# Patient Record
Sex: Female | Born: 1935 | Hispanic: No | Marital: Married | Smoking: Never smoker
Health system: Southern US, Community
[De-identification: ages and names within clinical notes are randomized; demographics above are authoritative.]

## PROBLEM LIST (undated history)

## (undated) ENCOUNTER — Emergency Department (HOSPITAL_COMMUNITY): Admission: EM | Payer: Self-pay | Source: Home / Self Care

## (undated) DIAGNOSIS — I255 Ischemic cardiomyopathy: Secondary | ICD-10-CM

## (undated) DIAGNOSIS — E785 Hyperlipidemia, unspecified: Secondary | ICD-10-CM

## (undated) DIAGNOSIS — I2109 ST elevation (STEMI) myocardial infarction involving other coronary artery of anterior wall: Secondary | ICD-10-CM

## (undated) DIAGNOSIS — I2510 Atherosclerotic heart disease of native coronary artery without angina pectoris: Secondary | ICD-10-CM

## (undated) DIAGNOSIS — E039 Hypothyroidism, unspecified: Secondary | ICD-10-CM

---

## 2017-01-20 DIAGNOSIS — C44311 Basal cell carcinoma of skin of nose: Secondary | ICD-10-CM | POA: Diagnosis not present

## 2017-01-20 DIAGNOSIS — L209 Atopic dermatitis, unspecified: Secondary | ICD-10-CM | POA: Diagnosis not present

## 2017-01-20 DIAGNOSIS — L820 Inflamed seborrheic keratosis: Secondary | ICD-10-CM | POA: Diagnosis not present

## 2017-02-02 DIAGNOSIS — D0439 Carcinoma in situ of skin of other parts of face: Secondary | ICD-10-CM | POA: Diagnosis not present

## 2017-05-03 DIAGNOSIS — E039 Hypothyroidism, unspecified: Secondary | ICD-10-CM | POA: Diagnosis not present

## 2017-05-03 DIAGNOSIS — Z79899 Other long term (current) drug therapy: Secondary | ICD-10-CM | POA: Diagnosis not present

## 2017-05-03 DIAGNOSIS — R110 Nausea: Secondary | ICD-10-CM | POA: Diagnosis not present

## 2017-05-03 DIAGNOSIS — E785 Hyperlipidemia, unspecified: Secondary | ICD-10-CM | POA: Diagnosis not present

## 2017-08-29 DIAGNOSIS — N3001 Acute cystitis with hematuria: Secondary | ICD-10-CM | POA: Diagnosis not present

## 2017-08-29 DIAGNOSIS — N3090 Cystitis, unspecified without hematuria: Secondary | ICD-10-CM | POA: Diagnosis not present

## 2017-08-29 DIAGNOSIS — M546 Pain in thoracic spine: Secondary | ICD-10-CM | POA: Diagnosis not present

## 2017-12-22 DIAGNOSIS — Z0000 Encounter for general adult medical examination without abnormal findings: Secondary | ICD-10-CM | POA: Diagnosis not present

## 2017-12-22 DIAGNOSIS — Z1331 Encounter for screening for depression: Secondary | ICD-10-CM | POA: Diagnosis not present

## 2017-12-22 DIAGNOSIS — E785 Hyperlipidemia, unspecified: Secondary | ICD-10-CM | POA: Diagnosis not present

## 2017-12-27 DIAGNOSIS — Z1211 Encounter for screening for malignant neoplasm of colon: Secondary | ICD-10-CM | POA: Diagnosis not present

## 2017-12-27 DIAGNOSIS — Z79899 Other long term (current) drug therapy: Secondary | ICD-10-CM | POA: Diagnosis not present

## 2017-12-27 DIAGNOSIS — E039 Hypothyroidism, unspecified: Secondary | ICD-10-CM | POA: Diagnosis not present

## 2017-12-27 DIAGNOSIS — Z0000 Encounter for general adult medical examination without abnormal findings: Secondary | ICD-10-CM | POA: Diagnosis not present

## 2017-12-27 DIAGNOSIS — E785 Hyperlipidemia, unspecified: Secondary | ICD-10-CM | POA: Diagnosis not present

## 2018-03-06 DIAGNOSIS — M67431 Ganglion, right wrist: Secondary | ICD-10-CM | POA: Diagnosis not present

## 2018-03-06 DIAGNOSIS — Z23 Encounter for immunization: Secondary | ICD-10-CM | POA: Diagnosis not present

## 2018-03-06 DIAGNOSIS — E039 Hypothyroidism, unspecified: Secondary | ICD-10-CM | POA: Diagnosis not present

## 2018-04-25 ENCOUNTER — Inpatient Hospital Stay (HOSPITAL_COMMUNITY)
Admission: AD | Admit: 2018-04-25 | Discharge: 2018-04-28 | DRG: 246 | Disposition: A | Discharge status: Home / Self Care | Payer: Medicare Other | Attending: Interventional Cardiology | Admitting: Interventional Cardiology

## 2018-04-25 ENCOUNTER — Inpatient Hospital Stay (HOSPITAL_COMMUNITY)
Admission: AD | Disposition: A | Discharge status: Home / Self Care | Payer: Self-pay | Attending: Interventional Cardiology

## 2018-04-25 ENCOUNTER — Other Ambulatory Visit: Payer: Self-pay

## 2018-04-25 ENCOUNTER — Encounter (HOSPITAL_COMMUNITY): Payer: Self-pay | Admitting: Interventional Cardiology

## 2018-04-25 DIAGNOSIS — E039 Hypothyroidism, unspecified: Secondary | ICD-10-CM | POA: Diagnosis present

## 2018-04-25 DIAGNOSIS — I2109 ST elevation (STEMI) myocardial infarction involving other coronary artery of anterior wall: Secondary | ICD-10-CM | POA: Diagnosis not present

## 2018-04-25 DIAGNOSIS — R7303 Prediabetes: Secondary | ICD-10-CM | POA: Diagnosis not present

## 2018-04-25 DIAGNOSIS — I219 Acute myocardial infarction, unspecified: Secondary | ICD-10-CM | POA: Diagnosis present

## 2018-04-25 DIAGNOSIS — I5041 Acute combined systolic (congestive) and diastolic (congestive) heart failure: Secondary | ICD-10-CM | POA: Diagnosis present

## 2018-04-25 DIAGNOSIS — I2510 Atherosclerotic heart disease of native coronary artery without angina pectoris: Secondary | ICD-10-CM | POA: Diagnosis present

## 2018-04-25 DIAGNOSIS — I255 Ischemic cardiomyopathy: Secondary | ICD-10-CM | POA: Diagnosis not present

## 2018-04-25 DIAGNOSIS — Z955 Presence of coronary angioplasty implant and graft: Secondary | ICD-10-CM

## 2018-04-25 DIAGNOSIS — I499 Cardiac arrhythmia, unspecified: Secondary | ICD-10-CM | POA: Diagnosis not present

## 2018-04-25 DIAGNOSIS — I5021 Acute systolic (congestive) heart failure: Secondary | ICD-10-CM | POA: Diagnosis not present

## 2018-04-25 DIAGNOSIS — I213 ST elevation (STEMI) myocardial infarction of unspecified site: Secondary | ICD-10-CM | POA: Diagnosis not present

## 2018-04-25 DIAGNOSIS — R0902 Hypoxemia: Secondary | ICD-10-CM | POA: Diagnosis not present

## 2018-04-25 DIAGNOSIS — I2102 ST elevation (STEMI) myocardial infarction involving left anterior descending coronary artery: Secondary | ICD-10-CM

## 2018-04-25 DIAGNOSIS — E785 Hyperlipidemia, unspecified: Secondary | ICD-10-CM | POA: Diagnosis not present

## 2018-04-25 DIAGNOSIS — R54 Age-related physical debility: Secondary | ICD-10-CM | POA: Diagnosis present

## 2018-04-25 DIAGNOSIS — I361 Nonrheumatic tricuspid (valve) insufficiency: Secondary | ICD-10-CM | POA: Diagnosis not present

## 2018-04-25 DIAGNOSIS — E782 Mixed hyperlipidemia: Secondary | ICD-10-CM | POA: Diagnosis not present

## 2018-04-25 DIAGNOSIS — R0789 Other chest pain: Secondary | ICD-10-CM | POA: Diagnosis not present

## 2018-04-25 DIAGNOSIS — R079 Chest pain, unspecified: Secondary | ICD-10-CM | POA: Diagnosis not present

## 2018-04-25 HISTORY — DX: ST elevation (STEMI) myocardial infarction involving other coronary artery of anterior wall: I21.09

## 2018-04-25 HISTORY — PX: LEFT HEART CATH AND CORONARY ANGIOGRAPHY: CATH118249

## 2018-04-25 HISTORY — DX: Hypothyroidism, unspecified: E03.9

## 2018-04-25 HISTORY — DX: Atherosclerotic heart disease of native coronary artery without angina pectoris: I25.10

## 2018-04-25 HISTORY — PX: CORONARY STENT INTERVENTION: CATH118234

## 2018-04-25 HISTORY — DX: Hyperlipidemia, unspecified: E78.5

## 2018-04-25 HISTORY — DX: Ischemic cardiomyopathy: I25.5

## 2018-04-25 LAB — HEMOGLOBIN A1C
Hgb A1c MFr Bld: 5.7 % — ABNORMAL HIGH (ref 4.8–5.6)
MEAN PLASMA GLUCOSE: 116.89 mg/dL

## 2018-04-25 LAB — COMPREHENSIVE METABOLIC PANEL
ALK PHOS: 64 U/L (ref 38–126)
ALT: 47 U/L (ref 14–54)
ANION GAP: 10 (ref 5–15)
AST: 211 U/L — ABNORMAL HIGH (ref 15–41)
Albumin: 3.1 g/dL — ABNORMAL LOW (ref 3.5–5.0)
BILIRUBIN TOTAL: 0.6 mg/dL (ref 0.3–1.2)
BUN: 13 mg/dL (ref 6–20)
CALCIUM: 8.7 mg/dL — AB (ref 8.9–10.3)
CO2: 23 mmol/L (ref 22–32)
Chloride: 108 mmol/L (ref 101–111)
Creatinine, Ser: 0.99 mg/dL (ref 0.44–1.00)
GFR, EST AFRICAN AMERICAN: 55 mL/min — AB (ref >60)
GFR, EST NON AFRICAN AMERICAN: 47 mL/min — AB (ref >60)
Glucose, Bld: 150 mg/dL — ABNORMAL HIGH (ref 65–99)
Potassium: 5.3 mmol/L — ABNORMAL HIGH (ref 3.5–5.1)
Sodium: 141 mmol/L (ref 135–145)
TOTAL PROTEIN: 6.5 g/dL (ref 6.5–8.1)

## 2018-04-25 LAB — PROTIME-INR
INR: 1.11
PROTHROMBIN TIME: 14.2 s (ref 11.4–15.2)

## 2018-04-25 LAB — POCT I-STAT, CHEM 8
BUN: 13 mg/dL (ref 6–20)
CALCIUM ION: 1.21 mmol/L (ref 1.15–1.40)
CHLORIDE: 107 mmol/L (ref 101–111)
CREATININE: 0.8 mg/dL (ref 0.44–1.00)
GLUCOSE: 159 mg/dL — AB (ref 65–99)
HCT: 34 % — ABNORMAL LOW (ref 36.0–46.0)
Hemoglobin: 11.6 g/dL — ABNORMAL LOW (ref 12.0–15.0)
Potassium: 3.6 mmol/L (ref 3.5–5.1)
Sodium: 143 mmol/L (ref 135–145)
TCO2: 20 mmol/L — ABNORMAL LOW (ref 22–32)

## 2018-04-25 LAB — TROPONIN I: Troponin I: >65 ng/mL (ref <0.03)

## 2018-04-25 LAB — CBC
HEMATOCRIT: 36.5 % (ref 36.0–46.0)
Hemoglobin: 12 g/dL (ref 12.0–15.0)
MCH: 30.5 pg (ref 26.0–34.0)
MCHC: 32.9 g/dL (ref 30.0–36.0)
MCV: 92.9 fL (ref 78.0–100.0)
Platelets: 232 10*3/uL (ref 150–400)
RBC: 3.93 MIL/uL (ref 3.87–5.11)
RDW: 12.7 % (ref 11.5–15.5)
WBC: 10.8 10*3/uL — ABNORMAL HIGH (ref 4.0–10.5)

## 2018-04-25 LAB — POCT ACTIVATED CLOTTING TIME
Activated Clotting Time: 483 seconds
Activated Clotting Time: 164 seconds

## 2018-04-25 LAB — MRSA PCR SCREENING: MRSA by PCR: NEGATIVE

## 2018-04-25 LAB — APTT: aPTT: 39 seconds — ABNORMAL HIGH (ref 24–36)

## 2018-04-25 SURGERY — LEFT HEART CATH AND CORONARY ANGIOGRAPHY
Anesthesia: LOCAL

## 2018-04-25 MED ORDER — HEPARIN SODIUM (PORCINE) 1000 UNIT/ML IJ SOLN
INTRAMUSCULAR | Status: DC | PRN
  Administered 2018-04-25: 4000 [IU] via INTRAVENOUS

## 2018-04-25 MED ORDER — NITROGLYCERIN 1 MG/10 ML FOR IR/CATH LAB
INTRA_ARTERIAL | Status: DC | PRN
  Administered 2018-04-25: 200 ug via INTRACORONARY

## 2018-04-25 MED ORDER — TICAGRELOR 90 MG PO TABS
ORAL_TABLET | ORAL | Status: AC
  Filled 2018-04-25: qty 2

## 2018-04-25 MED ORDER — HEPARIN (PORCINE) IN NACL 2-0.9 UNITS/ML
INTRAMUSCULAR | Status: AC | PRN
  Administered 2018-04-25 (×2): 500 mL

## 2018-04-25 MED ORDER — BIVALIRUDIN BOLUS VIA INFUSION - CUPID
INTRAVENOUS | Status: DC | PRN
  Administered 2018-04-25: 57 mg via INTRAVENOUS

## 2018-04-25 MED ORDER — SODIUM CHLORIDE 0.9 % IV SOLN: 250.0000 mL | INTRAVENOUS | Status: DC | PRN

## 2018-04-25 MED ORDER — HEPARIN SODIUM (PORCINE) 1000 UNIT/ML IJ SOLN
INTRAMUSCULAR | Status: AC
  Filled 2018-04-25: qty 1

## 2018-04-25 MED ORDER — ATORVASTATIN CALCIUM 80 MG PO TABS
80.0000 mg | ORAL_TABLET | Freq: Every day | ORAL | Status: DC
  Administered 2018-04-25 – 2018-04-27 (×3): 80 mg via ORAL
  Filled 2018-04-25 (×3): qty 1

## 2018-04-25 MED ORDER — METOPROLOL TARTRATE 12.5 MG HALF TABLET
12.5000 mg | ORAL_TABLET | Freq: Two times a day (BID) | ORAL | Status: DC
  Administered 2018-04-25 – 2018-04-28 (×6): 12.5 mg via ORAL
  Filled 2018-04-25 (×7): qty 1

## 2018-04-25 MED ORDER — BIVALIRUDIN TRIFLUOROACETATE 250 MG IV SOLR
INTRAVENOUS | Status: AC
  Filled 2018-04-25: qty 250

## 2018-04-25 MED ORDER — LIDOCAINE HCL (PF) 1 % IJ SOLN
INTRAMUSCULAR | Status: AC
  Filled 2018-04-25: qty 30

## 2018-04-25 MED ORDER — SODIUM CHLORIDE 0.9 % IV SOLN
INTRAVENOUS | Status: AC
  Administered 2018-04-25: 14:00:00 via INTRAVENOUS

## 2018-04-25 MED ORDER — SODIUM CHLORIDE 0.9% FLUSH
3.0000 mL | Freq: Two times a day (BID) | INTRAVENOUS | Status: DC
  Administered 2018-04-26 – 2018-04-28 (×5): 3 mL via INTRAVENOUS

## 2018-04-25 MED ORDER — TICAGRELOR 90 MG PO TABS
90.0000 mg | ORAL_TABLET | Freq: Two times a day (BID) | ORAL | Status: DC
  Administered 2018-04-25 – 2018-04-28 (×6): 90 mg via ORAL
  Filled 2018-04-25 (×6): qty 1

## 2018-04-25 MED ORDER — ONDANSETRON HCL 4 MG/2ML IJ SOLN: 4.0000 mg | Freq: Four times a day (QID) | INTRAMUSCULAR | Status: DC | PRN

## 2018-04-25 MED ORDER — TICAGRELOR 90 MG PO TABS
ORAL_TABLET | ORAL | Status: DC | PRN
  Administered 2018-04-25: 180 mg via ORAL

## 2018-04-25 MED ORDER — TIROFIBAN HCL IN NACL 5-0.9 MG/100ML-% IV SOLN
INTRAVENOUS | Status: DC | PRN
  Administered 2018-04-25: 0.15 ug/kg/min via INTRAVENOUS

## 2018-04-25 MED ORDER — SODIUM CHLORIDE 0.9 % IV SOLN
INTRAVENOUS | Status: AC | PRN
  Administered 2018-04-25: 1.75 mg/kg/h via INTRAVENOUS

## 2018-04-25 MED ORDER — ACETAMINOPHEN 325 MG PO TABS: 650.0000 mg | ORAL_TABLET | ORAL | Status: DC | PRN

## 2018-04-25 MED ORDER — LIDOCAINE HCL (PF) 1 % IJ SOLN
INTRAMUSCULAR | Status: DC | PRN
  Administered 2018-04-25: 20 mL

## 2018-04-25 MED ORDER — SODIUM CHLORIDE 0.9% FLUSH: 3.0000 mL | INTRAVENOUS | Status: DC | PRN

## 2018-04-25 MED ORDER — ASPIRIN 81 MG PO CHEW
81.0000 mg | CHEWABLE_TABLET | Freq: Every day | ORAL | Status: DC
Start: 2018-04-25 — End: 2018-04-28
  Administered 2018-04-26 – 2018-04-28 (×3): 81 mg via ORAL
  Filled 2018-04-25 (×3): qty 1

## 2018-04-25 MED ORDER — TIROFIBAN (AGGRASTAT) BOLUS VIA INFUSION
INTRAVENOUS | Status: DC | PRN
  Administered 2018-04-25: 1900 ug via INTRAVENOUS

## 2018-04-25 MED ORDER — IOHEXOL 350 MG/ML SOLN
INTRAVENOUS | Status: DC | PRN
  Administered 2018-04-25: 165 mL via INTRAVENOUS

## 2018-04-25 MED ORDER — HYDRALAZINE HCL 20 MG/ML IJ SOLN: 5.0000 mg | INTRAMUSCULAR | Status: AC | PRN

## 2018-04-25 MED ORDER — ORAL CARE MOUTH RINSE
15.0000 mL | Freq: Two times a day (BID) | OROMUCOSAL | Status: DC
  Administered 2018-04-25: 15 mL via OROMUCOSAL

## 2018-04-25 MED ORDER — TIROFIBAN HCL IN NACL 5-0.9 MG/100ML-% IV SOLN
INTRAVENOUS | Status: AC
  Filled 2018-04-25: qty 100

## 2018-04-25 MED ORDER — HEPARIN (PORCINE) IN NACL 1000-0.9 UT/500ML-% IV SOLN
INTRAVENOUS | Status: AC
  Filled 2018-04-25: qty 1000

## 2018-04-25 MED ORDER — LABETALOL HCL 5 MG/ML IV SOLN: 10.0000 mg | INTRAVENOUS | Status: AC | PRN

## 2018-04-25 SURGICAL SUPPLY — 15 items
BALLN SAPPHIRE 2.5X12 (BALLOONS) ×2
BALLOON SAPPHIRE 2.5X12 (BALLOONS) IMPLANT
CATH EXTRAC PRONTO 5.5F 138CM (CATHETERS) ×1 IMPLANT
CATH INFINITI 5FR MULTPACK ANG (CATHETERS) ×1 IMPLANT
CATH LAUNCHER 6FR EBU3.5 (CATHETERS) ×1 IMPLANT
KIT HEART LEFT (KITS) ×2 IMPLANT
PACK CARDIAC CATHETERIZATION (CUSTOM PROCEDURE TRAY) ×2 IMPLANT
SHEATH PINNACLE 6F 10CM (SHEATH) ×1 IMPLANT
STENT SYNERGY DES 2.75X20 (Permanent Stent) ×1 IMPLANT
STENT SYNERGY DES 3X12 (Permanent Stent) ×1 IMPLANT
TRANSDUCER W/STOPCOCK (MISCELLANEOUS) ×2 IMPLANT
TUBING CIL FLEX 10 FLL-RA (TUBING) ×2 IMPLANT
WIRE ASAHI PROWATER 180CM (WIRE) ×1 IMPLANT
WIRE EMERALD 3MM-J .035X150CM (WIRE) ×1 IMPLANT
WIRE HI TORQ BMW 190CM (WIRE) ×1 IMPLANT

## 2018-04-25 NOTE — Plan of Care (Signed)
  Problem: Education: Goal: Understanding of CV disease, CV risk reduction, and recovery process will improve Outcome: Progressing Note:  Education ongoing   Problem: Activity: Goal: Ability to return to baseline activity level will improve Outcome: Progressing Note:  Bedrest up at 0030   Problem: Cardiovascular: Goal: Ability to achieve and maintain adequate cardiovascular perfusion will improve Outcome: Progressing Note:  Within parameters Goal: Vascular access site(s) Level 0-1 will be maintained Outcome: Progressing Note:  Level 1. Small bruise   Problem: Health Behavior/Discharge Planning: Goal: Ability to safely manage health-related needs after discharge will improve Outcome: Progressing

## 2018-04-25 NOTE — Progress Notes (Signed)
1741 ACT 142 Right Femoral Arterial Sheath removed per protocol. Site held x 30 minutes. Groin Level 1. Instructed patient to apply pressure to right groin if she lifts her head, coughs, sneezes, laughs, etc.  Instructed on bedrest until 0011 tonight.Verbalized understanding.

## 2018-04-25 NOTE — Progress Notes (Signed)
Dr. Irish Lack notified of Critical Lab Value Troponin >65. Expected value. No new orders received.

## 2018-04-25 NOTE — H&P (Signed)
Cardiology Admission History and Physical:   Patient ID: April Yang; MRN: [id number]; DOB: 1921-06-08   Admission date: 04/25/2018  Primary Care Provider: No primary care provider on file. Primary Cardiologist: No primary care provider on file. [place] Primary Electrophysiologist:   Chief Complaint:  Chest pain/back pain  Patient Profile:   April Yang is a 84 y.o. female with a history of hypothyroidism who is otherwise healthy and lives independently.   History of Present Illness:   Ms. Loder  began having severe back and chest pain at about 8AM.  It lasted several hours and a neighbor came to check on her.  THe patient was noted to be sweaty and uncomfortable.  EMS was called.  ECG revealed anterior ST elevation.    She was sent to [facility] for emergency eval.  In the ER, she was still uncomfortable.    Rushed to emergent cath which limited time for obtaining history.   Past Medical History:  Diagnosis Date  . Hypothyroidism        Medications Prior to Admission: Prior to Admission medications   Not on File     Allergies:   Allergies not on file  Social History:   Social History   Socioeconomic History  . Marital status: Married    Spouse name: Not on file  . Number of children: Not on file  . Years of education: Not on file  . Highest education level: Not on file  Occupational History  . Not on file  Social Needs  . Financial resource strain: Not on file  . Food insecurity:    Worry: Not on file    Inability: Not on file  . Transportation needs:    Medical: Not on file    Non-medical: Not on file  Tobacco Use  . Smoking status: Never Smoker  Substance and Sexual Activity  . Alcohol use: Not on file  . Drug use: Not on file  . Sexual activity: Not on file  Lifestyle  . Physical activity:    Days per week: Not on file    Minutes per session: Not on file  . Stress: Not on file  Relationships  . Social connections:    Talks on phone: Not on file      Gets together: Not on file    Attends religious service: Not on file    Active member of club or organization: Not on file    Attends meetings of clubs or organizations: Not on file    Relationship status: Not on file  . Intimate partner violence:    Fear of current or ex partner: Not on file    Emotionally abused: Not on file    Physically abused: Not on file    Forced sexual activity: Not on file  Other Topics Concern  . Not on file  Social History Narrative  . Not on file    Family History:  Not assessed due to emergent nature of illness The patient's family history is not on file.    ROS:  Please see the history of present illness. Chest pain All other ROS reviewed and negative.     Physical Exam/Data:   Vitals:   04/25/18 1509 04/25/18 1515 04/25/18 1530 04/25/18 1545  BP: (!) 155/77 133/80 130/67 122/66  Pulse: 65 66 66 (!) 56  Resp:  17 15 15   Temp:      TempSrc:      SpO2:  97% 100% 98%    Intake/Output  Summary (Last 24 hours) at 04/25/2018 1601 Last data filed at 04/25/2018 1500 Gross per 24 hour  Intake 56 ml  Output -  Net 56 ml   There were no vitals filed for this visit. There is no height or weight on file to calculate BMI.  General:  Grey, sweaty, in severe acute distress HEENT: normal Lymph: no adenopathy Neck: no JVD Endocrine:  No thryomegaly Vascular: No carotid bruits; FA pulses 2+ bilaterally without bruits  Cardiac:  normal S1, S2; RRR; no murmur  Lungs:  clear to auscultation bilaterally, no wheezing, rhonchi or rales  Abd: soft, nontender, no hepatomegaly  Ext: no edema 2+ right femoral pulse Musculoskeletal:  No deformities, BUE and BLE strength normal and equal Skin: warm and dry  Neuro:  CNs 2-12 intact, no focal abnormalities noted Psych:  Normal affect    EKG:  The ECG that was done by EMS was personally reviewed and demonstrates NSR, anterior ST elevation  Relevant CV Studies: ECG as above  Laboratory Data:  ChemistryNo  results for input(s): NA, K, CL, CO2, GLUCOSE, BUN, CREATININE, CALCIUM, GFRNONAA, GFRAA, ANIONGAP in the last 168 hours.  No results for input(s): PROT, ALBUMIN, AST, ALT, ALKPHOS, BILITOT in the last 168 hours. Hematology Recent Labs  Lab 04/25/18 1357  WBC 10.8*  RBC 3.93  HGB 12.0  HCT 36.5  MCV 92.9  MCH 30.5  MCHC 32.9  RDW 12.7  PLT 232   Cardiac Enzymes Recent Labs  Lab 04/25/18 1357  TROPONINI >65.00*   No results for input(s): TROPIPOC in the last 168 hours.  BNPNo results for input(s): BNP, PROBNP in the last 168 hours.  DDimer No results for input(s): DDIMER in the last 168 hours.  Radiology/Studies:  No results found.  Assessment and Plan:   1. Acute anterior MI: I personally evaluated the patient in the ER and made the decision for her to come for emergent cath.  SHe lives independently and gardens regularly.  Due to her excellent health status, we elected to take her for cath.  She received 2 DES to the LAD.  SHe had significant RCA disease as well.  LVEDP was moderately elevated.    SHe likely has acute systolic heart failure.  Watch volume status.  DAPT, start lipid lowering therapy.    Severity of Illness: The appropriate patient status for this patient is INPATIENT. Inpatient status is judged to be reasonable and necessary in order to provide the required intensity of service to ensure the patient's safety. The patient's presenting symptoms, physical exam findings, and initial radiographic and laboratory data in the context of their chronic comorbidities is felt to place them at high risk for further clinical deterioration. Furthermore, it is not anticipated that the patient will be medically stable for discharge from the hospital within 2 midnights of admission. The following factors support the patient status of inpatient.   " The patient's presenting symptoms include chest pain. " The worrisome physical exam findings include pale color,  diaphoresis. " The initial radiographic and laboratory data are worrisome because of anterior STEMI. " The chronic co-morbidities include .   * I certify that at the point of admission it is my clinical judgment that the patient will require inpatient hospital care spanning beyond 2 midnights from the point of admission due to high intensity of service, high risk for further deterioration and high frequency of surveillance required.*    For questions or updates, please contact [place] Please consult [email or web address] for contact  info under Cardiology/STEMI.    SignedLarae Grooms, MD  04/25/2018 4:01 PM

## 2018-04-26 ENCOUNTER — Other Ambulatory Visit: Payer: Self-pay

## 2018-04-26 ENCOUNTER — Inpatient Hospital Stay (HOSPITAL_COMMUNITY): Payer: Medicare Other

## 2018-04-26 ENCOUNTER — Encounter (HOSPITAL_COMMUNITY): Payer: Self-pay

## 2018-04-26 DIAGNOSIS — R7303 Prediabetes: Secondary | ICD-10-CM

## 2018-04-26 DIAGNOSIS — I5021 Acute systolic (congestive) heart failure: Secondary | ICD-10-CM

## 2018-04-26 DIAGNOSIS — I361 Nonrheumatic tricuspid (valve) insufficiency: Secondary | ICD-10-CM

## 2018-04-26 DIAGNOSIS — E782 Mixed hyperlipidemia: Secondary | ICD-10-CM

## 2018-04-26 LAB — COMPREHENSIVE METABOLIC PANEL
ALBUMIN: 3.1 g/dL — AB (ref 3.5–5.0)
ALT: 47 U/L (ref 14–54)
ANION GAP: 9 (ref 5–15)
AST: 186 U/L — ABNORMAL HIGH (ref 15–41)
Alkaline Phosphatase: 67 U/L (ref 38–126)
BILIRUBIN TOTAL: 0.7 mg/dL (ref 0.3–1.2)
BUN: 14 mg/dL (ref 6–20)
CO2: 24 mmol/L (ref 22–32)
Calcium: 8.9 mg/dL (ref 8.9–10.3)
Chloride: 108 mmol/L (ref 101–111)
Creatinine, Ser: 0.98 mg/dL (ref 0.44–1.00)
GFR calc Af Amer: 55 mL/min — ABNORMAL LOW (ref >60)
GFR calc non Af Amer: 48 mL/min — ABNORMAL LOW (ref >60)
GLUCOSE: 150 mg/dL — AB (ref 65–99)
POTASSIUM: 4.8 mmol/L (ref 3.5–5.1)
Sodium: 141 mmol/L (ref 135–145)
TOTAL PROTEIN: 6.7 g/dL (ref 6.5–8.1)

## 2018-04-26 LAB — CBC
HCT: 36.5 % (ref 36.0–46.0)
Hemoglobin: 11.8 g/dL — ABNORMAL LOW (ref 12.0–15.0)
MCH: 30.3 pg (ref 26.0–34.0)
MCHC: 32.3 g/dL (ref 30.0–36.0)
MCV: 93.8 fL (ref 78.0–100.0)
PLATELETS: 240 10*3/uL (ref 150–400)
RBC: 3.89 MIL/uL (ref 3.87–5.11)
RDW: 13 % (ref 11.5–15.5)
WBC: 8 10*3/uL (ref 4.0–10.5)

## 2018-04-26 LAB — ECHOCARDIOGRAM COMPLETE
HEIGHTINCHES: 65 in
WEIGHTICAEL: 3058.22 [oz_av]

## 2018-04-26 LAB — POCT ACTIVATED CLOTTING TIME: ACTIVATED CLOTTING TIME: 142 s

## 2018-04-26 LAB — TROPONIN I: Troponin I: >65 ng/mL (ref <0.03)

## 2018-04-26 MED ORDER — FUROSEMIDE 10 MG/ML IJ SOLN
20.0000 mg | Freq: Once | INTRAMUSCULAR | Status: AC
  Administered 2018-04-26: 20 mg via INTRAVENOUS
  Filled 2018-04-26: qty 2

## 2018-04-26 MED FILL — Heparin Sod (Porcine)-NaCl IV Soln 1000 Unit/500ML-0.9%: INTRAVENOUS | Qty: 1000 | Status: AC

## 2018-04-26 NOTE — Plan of Care (Signed)
  Problem: Education: Goal: Knowledge of General Education information will improve Outcome: Progressing   Problem: Clinical Measurements: Goal: Ability to maintain clinical measurements within normal limits will improve Outcome: Progressing   Problem: Clinical Measurements: Goal: Will remain free from infection Outcome: Progressing   Problem: Activity: Goal: Risk for activity intolerance will decrease Outcome: Progressing   Problem: Nutrition: Goal: Adequate nutrition will be maintained Outcome: Progressing   Problem: Coping: Goal: Level of anxiety will decrease Outcome: Progressing   Problem: Elimination: Goal: Will not experience complications related to bowel motility Outcome: Progressing   Problem: Pain Managment: Goal: General experience of comfort will improve Outcome: Progressing   Problem: Safety: Goal: Ability to remain free from injury will improve Outcome: Progressing

## 2018-04-26 NOTE — Progress Notes (Signed)
Patient complaining on left side chest pain that lasted just a few minutes. EKG completed. Called Dr. Christ Kick and reported situation to him. Echo at bedside. No orders given. Will continue to monitor.

## 2018-04-26 NOTE — Progress Notes (Addendum)
Progress Note  Patient Name: April Yang Date of Encounter: 04/26/2018  Primary Cardiologist: No primary care provider on file.   Subjective   S/p LAD PCI yesterday.  Inpatient Medications    Scheduled Meds: . aspirin  81 mg Oral Daily  . atorvastatin  80 mg Oral q1800  . mouth rinse  15 mL Mouth Rinse BID  . metoprolol tartrate  12.5 mg Oral BID  . sodium chloride flush  3 mL Intravenous Q12H  . ticagrelor  90 mg Oral BID   Continuous Infusions: . sodium chloride     PRN Meds: sodium chloride, acetaminophen, ondansetron (ZOFRAN) IV, sodium chloride flush   Vital Signs    Vitals:   04/26/18 0500 04/26/18 0600 04/26/18 0700 04/26/18 0724  BP:  128/79 115/70   Pulse: 75 68 65   Resp: (!) 24 17 (!) 23   Temp:    98.9 F (37.2 C)  TempSrc:    Oral  SpO2: 94% 95% 92%   Weight:      Height:        Intake/Output Summary (Last 24 hours) at 04/26/2018 0824 Last data filed at 04/26/2018 0400 Gross per 24 hour  Intake 416 ml  Output 600 ml  Net -184 ml   Filed Weights   04/25/18 1915  Weight: 191 lb 2.2 oz (86.7 kg)    Telemetry    NSR - Personally Reviewed  ECG    NSR, anterior Q waves with mild ST elevation- better than inital - Personally Reviewed  Physical Exam   GEN: No acute distress.   Neck: No JVD Cardiac: RRR, no murmurs, rubs, or gallops.  Respiratory: Clear to auscultation bilaterally. GI: Soft, nontender, non-distended  MS: No edema; No deformity. Neuro:  Nonfocal  Psych: Normal affect   Labs    Chemistry Recent Labs  Lab 04/25/18 1236 04/25/18 2150 04/26/18 0212  NA 143 141 141  K 3.6 5.3* 4.8  CL 107 108 108  CO2  --  23 24  GLUCOSE 159* 150* 150*  BUN 13 13 14   CREATININE 0.80 0.99 0.98  CALCIUM  --  8.7* 8.9  PROT  --  6.5 6.7  ALBUMIN  --  3.1* 3.1*  AST  --  211* 186*  ALT  --  47 47  ALKPHOS  --  64 67  BILITOT  --  0.6 0.7  GFRNONAA  --  47* 48*  GFRAA  --  55* 55*  ANIONGAP  --  10 9      Hematology Recent Labs  Lab 04/25/18 1236 04/25/18 1357 04/26/18 0212  WBC  --  10.8* 8.0  RBC  --  3.93 3.89  HGB 11.6* 12.0 11.8*  HCT 34.0* 36.5 36.5  MCV  --  92.9 93.8  MCH  --  30.5 30.3  MCHC  --  32.9 32.3  RDW  --  12.7 13.0  PLT  --  232 240    Cardiac Enzymes Recent Labs  Lab 04/25/18 1357 04/25/18 2150 04/26/18 0212  TROPONINI >65.00* >65.00* >65.00*   No results for input(s): TROPIPOC in the last 168 hours.   BNPNo results for input(s): BNP, PROBNP in the last 168 hours.   DDimer No results for input(s): DDIMER in the last 168 hours.   Radiology    No results found.  Cardiac Studies   Cath showing occluded LAD  Patient Profile     84 y.o. female with anterior MI  Assessment & Plan  Anterior MI: DAPT for 12 months.  Echo to eval LV function.  Suspect there will be some LV dysfunction- mild acute systolic heart falure.  She has some orthopnea.  Will give a dose of Lasix. Check BMet in AM.    High dose statin.  LDL target will be 70.  Watch in ICU today given frailty.  Will plan to transfer to tele tomorrow if she is doing well.   Prediabetes with A1C 5.7. Healthy diet.  For questions or updates, please contact [place] Please consult [email or web address] for contact info under Cardiology/STEMI.      Signed, Larae Grooms, MD  04/26/2018, 8:24 AM

## 2018-04-26 NOTE — Progress Notes (Signed)
  Echocardiogram 2D Echocardiogram has been performed.  Madelaine Etienne 04/26/2018, 2:15 PM

## 2018-04-26 NOTE — Progress Notes (Addendum)
Jacqualin Combes, RN        # 12.  S/W ISHA @ [facility] # [phone]    BRILINTA  90 MG  BID  COVER- YES  CO-PAY- $ 45.00  Q/L TWO PILL PER DAYS  TIER- 3 DRUG  PRIOR APPROVAL- NO    PREFERRED PHARMACY : [facility], [facility] AND [facility]    Brilinta 30 day free card provided to patient and family, questions answered.

## 2018-04-27 LAB — LIPID PANEL
CHOLESTEROL: 158 mg/dL (ref 0–200)
HDL: 30 mg/dL — ABNORMAL LOW (ref >40)
LDL Cholesterol: 88 mg/dL (ref 0–99)
TRIGLYCERIDES: 198 mg/dL — AB (ref <150)
Total CHOL/HDL Ratio: 5.3 RATIO
VLDL: 40 mg/dL (ref 0–40)

## 2018-04-27 LAB — BASIC METABOLIC PANEL
Anion gap: 9 (ref 5–15)
BUN: 16 mg/dL (ref 6–20)
CHLORIDE: 108 mmol/L (ref 101–111)
CO2: 22 mmol/L (ref 22–32)
Calcium: 8.7 mg/dL — ABNORMAL LOW (ref 8.9–10.3)
Creatinine, Ser: 0.95 mg/dL (ref 0.44–1.00)
GFR, EST AFRICAN AMERICAN: 58 mL/min — AB (ref >60)
GFR, EST NON AFRICAN AMERICAN: 50 mL/min — AB (ref >60)
Glucose, Bld: 116 mg/dL — ABNORMAL HIGH (ref 65–99)
Potassium: 3.6 mmol/L (ref 3.5–5.1)
SODIUM: 139 mmol/L (ref 135–145)

## 2018-04-27 MED ORDER — SPIRONOLACTONE 12.5 MG HALF TABLET: 12.5000 mg | ORAL_TABLET | Freq: Every day | ORAL | Status: DC

## 2018-04-27 MED ORDER — LEVOTHYROXINE SODIUM 75 MCG PO TABS: 75.0000 ug | ORAL_TABLET | Freq: Every day | ORAL | Status: DC

## 2018-04-27 MED ORDER — LEVOTHYROXINE SODIUM 75 MCG PO TABS
75.0000 ug | ORAL_TABLET | Freq: Every day | ORAL | Status: DC
  Administered 2018-04-28: 75 ug via ORAL
  Filled 2018-04-27: qty 1

## 2018-04-27 MED ORDER — SPIRONOLACTONE 12.5 MG HALF TABLET
12.5000 mg | ORAL_TABLET | Freq: Every day | ORAL | Status: DC
  Administered 2018-04-27 – 2018-04-28 (×2): 12.5 mg via ORAL
  Filled 2018-04-27 (×2): qty 1

## 2018-04-27 NOTE — Plan of Care (Signed)
Pt gets very confused at night and has to be reoriented that she is in the hospital and not at home.

## 2018-04-27 NOTE — Evaluation (Signed)
Physical Therapy Evaluation Patient Details Name: CARLTON SWEANEY MRN: [id number] DOB: 12-04-1920 Today's Date: 04/27/2018   History of Present Illness  84 yo admitted with acute anterior STEMI s/p PCI. PMHx: hypothyroidism  Clinical Impression  Pt pleasant and agreeable to PT with 2 daughters present and supportive. Pt able to ambulate functionally with RW and navigate 5 stairs sideways with BUE on railing. Pt demonstrates deficits related to balance as pt was previously using no AD but displayed significantly decreased stability without RW during today's session. Pt would benefit from acute PT to address above deficits and improve level of independence. Pt SpO2 98% on RA after activity. HR: 68 pre gait > 90 during ambulation. BP pre: 113/62; BP post: 111/71  Follow Up Recommendations Home health PT;Supervision/Assistance - 24 hour    Equipment Recommendations  None recommended by PT    Recommendations for Other Services       Precautions / Restrictions Precautions Precautions: Fall      Mobility  Bed Mobility               General bed mobility comments: in chair on arrival  Transfers Overall transfer level: Needs assistance   Transfers: Sit to/from Stand Sit to Stand: Min guard         General transfer comment: cues for hand placement, sequence and safety  Ambulation/Gait Ambulation/Gait assistance: Min guard Ambulation Distance (Feet): 100 Feet Assistive device: Rolling walker (2 wheeled) Gait Pattern/deviations: Step-through pattern;Decreased stride length;Trunk flexed   Gait velocity interpretation: <1.8 ft/sec, indicate of risk for recurrent falls General Gait Details: slow gait with flexed trunk, cues to step into RW and to clear obstacles  Stairs Stairs: Yes Stairs assistance: Supervision Stair Management: Step to pattern;Sideways Number of Stairs: 4 General stair comments: pt able to perform safely with rail   Wheelchair Mobility    Modified  [place] (Stroke Patients Only)       Balance Overall balance assessment: Needs assistance Sitting-balance support: No upper extremity supported;Feet supported Sitting balance-Leahy Scale: Good Sitting balance - Comments: Pt able to sit without UE support and turn for donning back gown   Standing balance support: No upper extremity supported Standing balance-Leahy Scale: Fair Standing balance comment: Pt able to stand without AD but begins reaching for objects when beginning to ambulate                             Pertinent Vitals/Pain Pain Assessment: No/denies pain    Home Living Family/patient expects to be discharged to:: Private residence Living Arrangements: Alone;Children Available Help at Discharge: Family Type of Home: House Home Access: Stairs to enter Entrance Stairs-Rails: Psychiatric nurse of Steps: 4 Home Layout: One level Home Equipment: Environmental consultant - 2 wheels;Cane - single point Additional Comments: pt stays in her house during the day and spends the night at her daughters house    Prior Function Level of Independence: Independent         Comments: pt doesn't drive, cooks for herself, kids bring her the groceries     Hand Dominance        Extremity/Trunk Assessment   Upper Extremity Assessment Upper Extremity Assessment: Generalized weakness    Lower Extremity Assessment Lower Extremity Assessment: Generalized weakness    Cervical / Trunk Assessment Cervical / Trunk Assessment: Kyphotic  Communication   Communication: HOH  Cognition Arousal/Alertness: Awake/alert Behavior During Therapy: WFL for tasks assessed/performed Overall Cognitive Status: Within Functional Limits for tasks  assessed                                        General Comments      Exercises     Assessment/Plan    PT Assessment Patient needs continued PT services  PT Problem List Decreased strength;Decreased mobility;Decreased  safety awareness;Decreased activity tolerance;Decreased knowledge of use of DME       PT Treatment Interventions Gait training;Therapeutic activities;Therapeutic exercise;DME instruction;Functional mobility training;Balance training;Patient/family education    PT Goals (Current goals can be found in the Care Plan section)  Acute Rehab PT Goals Patient Stated Goal: take care of my tomato plants PT Goal Formulation: With patient/family Time For Goal Achievement: 05/11/18 Potential to Achieve Goals: Good    Frequency Min 3X/week   Barriers to discharge        Co-evaluation               AM-PAC PT "6 Clicks" Daily Activity  Outcome Measure Difficulty turning over in bed (including adjusting bedclothes, sheets and blankets)?: A Little Difficulty moving from lying on back to sitting on the side of the bed? : A Little Difficulty sitting down on and standing up from a chair with arms (e.g., wheelchair, bedside commode, etc,.)?: A Little Help needed moving to and from a bed to chair (including a wheelchair)?: A Little Help needed walking in hospital room?: A Little Help needed climbing 3-5 steps with a railing? : None 6 Click Score: 19    End of Session Equipment Utilized During Treatment: Gait belt Activity Tolerance: Patient tolerated treatment well Patient left: in chair;with call bell/phone within reach;with chair alarm set;with family/visitor present Nurse Communication: Mobility status PT Visit Diagnosis: Other abnormalities of gait and mobility (R26.89);Muscle weakness (generalized) (M62.81)    Time: 4431-5400 PT Time Calculation (min) (ACUTE ONLY): 20 min   Charges:   PT Evaluation $PT Eval Moderate Complexity: 1 Mod     PT G Codes:        Gabe Arnett Duddy, SPT  [employer] 04/27/2018, 1:30 PM

## 2018-04-27 NOTE — Progress Notes (Signed)
Progress Note  Patient Name: April Yang Date of Encounter: 04/27/2018  Primary Cardiologist: No primary care provider on file.   Subjective   Had some chest pain yesterday.  Inpatient Medications    Scheduled Meds: . aspirin  81 mg Oral Daily  . atorvastatin  80 mg Oral q1800  . [START ON 04/28/2018] levothyroxine  75 mcg Oral QAC breakfast  . metoprolol tartrate  12.5 mg Oral BID  . sodium chloride flush  3 mL Intravenous Q12H  . spironolactone  12.5 mg Oral Daily  . ticagrelor  90 mg Oral BID   Continuous Infusions: . sodium chloride     PRN Meds: sodium chloride, acetaminophen, ondansetron (ZOFRAN) IV, sodium chloride flush   Vital Signs    Vitals:   04/27/18 0500 04/27/18 0700 04/27/18 0800 04/27/18 0900  BP: 105/68 130/63 117/66 (!) 85/49  Pulse: 67 63 69 75  Resp: (!) 25 17 (!) 25 (!) 24  Temp:      TempSrc:      SpO2: 95% 98% 98% 98%  Weight:      Height:        Intake/Output Summary (Last 24 hours) at 04/27/2018 1139 Last data filed at 04/27/2018 0800 Gross per 24 hour  Intake 900 ml  Output 300 ml  Net 600 ml   Filed Weights   04/25/18 1915  Weight: 191 lb 2.2 oz (86.7 kg)    Telemetry    NSR - Personally Reviewed  ECG    NSR, improved ST segments - Personally Reviewed  Physical Exam   GEN: No acute distress.   Neck: No JVD Cardiac: RRR, no murmurs, rubs, or gallops.  Respiratory: Clear to auscultation bilaterally. GI: Soft, nontender, non-distended  MS: No edema; No deformity. Neuro:  Nonfocal  Psych: Normal affect   Labs    Chemistry Recent Labs  Lab 04/25/18 2150 04/26/18 0212 04/27/18 0330  NA 141 141 139  K 5.3* 4.8 3.6  CL 108 108 108  CO2 23 24 22   GLUCOSE 150* 150* 116*  BUN 13 14 16   CREATININE 0.99 0.98 0.95  CALCIUM 8.7* 8.9 8.7*  PROT 6.5 6.7  --   ALBUMIN 3.1* 3.1*  --   AST 211* 186*  --   ALT 47 47  --   ALKPHOS 64 67  --   BILITOT 0.6 0.7  --   GFRNONAA 47* 48* 50*  GFRAA 55* 55* 58*  ANIONGAP  10 9 9      Hematology Recent Labs  Lab 04/25/18 1236 04/25/18 1357 04/26/18 0212  WBC  --  10.8* 8.0  RBC  --  3.93 3.89  HGB 11.6* 12.0 11.8*  HCT 34.0* 36.5 36.5  MCV  --  92.9 93.8  MCH  --  30.5 30.3  MCHC  --  32.9 32.3  RDW  --  12.7 13.0  PLT  --  232 240    Cardiac Enzymes Recent Labs  Lab 04/25/18 1357 04/25/18 2150 04/26/18 0212  TROPONINI >65.00* >65.00* >65.00*   No results for input(s): TROPIPOC in the last 168 hours.   BNPNo results for input(s): BNP, PROBNP in the last 168 hours.   DDimer No results for input(s): DDIMER in the last 168 hours.   Radiology    No results found.  Cardiac Studies   Echo showed EF 35-40%  Patient Profile     84 y.o. female with anterior STEMI  Assessment & Plan    1) Doing well.  EF  decreased.  Start spironolactone 12.5 mg daily as she had some volume overload sx.  Use spiro rather than lasix.  Check BMet in AM. Move to tele. COntinue high dose statin.  DAPT and beta blocker started.  Holding off on ACE due to low BP.    Possible discharge in AM.  Need to get her walking.  Will order PT consult.  For questions or updates, please contact [place] Please consult [email or web address] for contact info under Cardiology/STEMI.      Signed, Larae Grooms, MD  04/27/2018, 11:39 AM

## 2018-04-28 ENCOUNTER — Encounter (HOSPITAL_COMMUNITY): Payer: Self-pay | Admitting: Physician Assistant

## 2018-04-28 LAB — BASIC METABOLIC PANEL
Anion gap: 11 (ref 5–15)
BUN: 15 mg/dL (ref 6–20)
CALCIUM: 8.7 mg/dL — AB (ref 8.9–10.3)
CO2: 21 mmol/L — ABNORMAL LOW (ref 22–32)
Chloride: 104 mmol/L (ref 101–111)
Creatinine, Ser: 0.96 mg/dL (ref 0.44–1.00)
GFR calc Af Amer: 57 mL/min — ABNORMAL LOW (ref >60)
GFR, EST NON AFRICAN AMERICAN: 49 mL/min — AB (ref >60)
Glucose, Bld: 106 mg/dL — ABNORMAL HIGH (ref 65–99)
POTASSIUM: 3.3 mmol/L — AB (ref 3.5–5.1)
SODIUM: 136 mmol/L (ref 135–145)

## 2018-04-28 MED ORDER — ASPIRIN EC 81 MG PO TBEC: 81.0000 mg | DELAYED_RELEASE_TABLET | Freq: Every day | ORAL | Status: DC

## 2018-04-28 MED ORDER — SPIRONOLACTONE 25 MG PO TABS: 12.5000 mg | ORAL_TABLET | Freq: Every day | ORAL | 6 refills | Status: DC

## 2018-04-28 MED ORDER — ATORVASTATIN CALCIUM 80 MG PO TABS: 80.0000 mg | ORAL_TABLET | Freq: Every day | ORAL | 6 refills | Status: DC

## 2018-04-28 MED ORDER — NITROGLYCERIN 0.4 MG SL SUBL: 0.4000 mg | SUBLINGUAL_TABLET | SUBLINGUAL | 12 refills | Status: AC | PRN

## 2018-04-28 MED ORDER — METOPROLOL TARTRATE 25 MG PO TABS: 12.5000 mg | ORAL_TABLET | Freq: Two times a day (BID) | ORAL | 6 refills | Status: DC

## 2018-04-28 MED ORDER — TICAGRELOR 90 MG PO TABS: 90.0000 mg | ORAL_TABLET | Freq: Two times a day (BID) | ORAL | 11 refills | Status: DC

## 2018-04-28 MED ORDER — POTASSIUM CHLORIDE CRYS ER 20 MEQ PO TBCR
40.0000 meq | EXTENDED_RELEASE_TABLET | Freq: Once | ORAL | Status: AC
  Administered 2018-04-28: 40 meq via ORAL
  Filled 2018-04-28: qty 2

## 2018-04-28 NOTE — Progress Notes (Signed)
CARDIAC REHAB PHASE I   PRE:  Rate/Rhythm: 67 SR    BP: sitting 131/66    SaO2: 98 RA  MODE:  Ambulation: 190 ft   POST:  Rate/Rhythm: 83 SR    BP: sitting 111/59     SaO2: 96 RA  Pt able to stand and walk with RW. Tolerated well, steady, just tired toward end. Return to recliner, VSS. Ed completed with pt and two daughters. Good reception. Understands importance of Brilinta/ASA. Discussed HF and low sodium and daily wts. Will refer to [place] [id number]   [place], ACSM 04/28/2018 11:47 AM

## 2018-04-28 NOTE — Discharge Summary (Addendum)
Discharge Summary    Patient ID: April Yang,  MRN: [id number], DOB/AGE: 1920/12/08 84 y.o.  Admit date: 04/25/2018 Discharge date: 04/28/2018  Primary Care Provider: Bertram Millard A Primary Cardiologist: Dr. Irish Lack  Discharge Diagnoses    Active Problems:   Acute anterior wall MI [facility])   Acute systolic and diastolic CHF   ICM   HLD   Pre-diabetes   Hypothyroidism   Allergies No Known Allergies  Diagnostic Studies/Procedures   LEFT HEART CATH AND CORONARY ANGIOGRAPHY 04/25/2018  Conclusion    Prox RCA lesion is 75% stenosed.  Mid Cx lesion is 25% stenosed.  LV end diastolic pressure is moderately elevated. LVEDP 23 mm Hg.  There is no aortic valve stenosis.  Prox LAD lesion is 100% stenosed.  A stent was successfully placed using a STENT SYNERGY DES 3X12.  A drug-eluting stent was successfully placed using a STENT SYNERGY DES 2.75X20.  Post intervention, there is a 0% residual stenosis.     Diagnostic Diagram       Post-Intervention Diagram          Echo 04/26/2018 Study Conclusions  - Left ventricle: The cavity size was normal. Wall thickness was   normal. Systolic function was moderately reduced. The estimated   ejection fraction was in the range of 35% to 40%. Severe   anterior, anteroseptal and apical and inferoapical hypokinesis,   suggestive of LAD territory ischemia/infarct. Doppler parameters   are consistent with abnormal left ventricular relaxation (grade 1   diastolic dysfunction). The E/e&' ratio is >15, suggesting   elevated LV filling pressure. - Mitral valve: Mildly thickened leaflets . There was trivial   regurgitation. - Left atrium: The atrium was normal in size. - Right ventricle: The cavity size was normal. Wall thickness was   normal. Systolic function is mildly reduced. - Tricuspid valve: There was mild regurgitation. - Pulmonary arteries: PA peak pressure: 30 mm Hg (S). - Inferior vena cava: The vessel was  dilated. The respirophasic   diameter changes were blunted (< 50%), consistent with elevated   central venous pressure. - Pericardium, extracardiac: A trivial pericardial effusion was   identified posterior to the heart.  Impressions:  - LVEF 35-40%, normal wall thickness, severe anterior,   anteroseptal, apical and inferoapical hypokinesis suggestive of   LAD territory ischemia/infarct, grade 1 DD with elevated LV   filling pressure, trivial MR, normal LA size, mildly reduced RV   systolic function, mild TR, RVSP 30 mmHg, dilated IVC, trivial   posterior pericardial effusion.  History of Present Illness     April Yang is a 83 y.o. female with a history of hypothyroidism who is otherwise healthy and lives independently presented 04/25/18 with anterior STEMI.   April Yang  began having severe back and chest pain at about 8AM on 04/25/18.  It lasted several hours and a neighbor came to check on her.  THe patient was noted to be sweaty and uncomfortable.  EMS was called.  ECG revealed anterior ST elevation.    She was sent to [facility] for emergency eval.  In the ER, she was still uncomfortable and taken to cath lab directly.   Hospital Course     Consultants: None  1. Acute anterior MI: Emergent cath showed 100% stenosed pLAD s/p PTCA and DES x 2. She had significant RCA (70%)  disease as well>> medical management .  LVEDP was moderately elevated. No recurrent chest pain. Ambulated well without any discomfort with cardiac rehab.  Continue ASA, Brillinta, statin and BB.  2. ICM/Acute combined CHF: Elevated LVEDP by cath. She had orthopnea. Given Lasix x 1 with improved symptoms. Added spironolactone for diuresis and to help increase LV function. BP soft to add ACE/ARB. Consider as outpatient. Heart failure education given.  3. HLD: 04/27/2018: Cholesterol 158; HDL 30; LDL Cholesterol 88; Triglycerides 198; VLDL 40. Continue Statin. Lipid panel and LFTs in 6 weeks.   The patient has been seen by  Dr. Irish Lack today and deemed ready for discharge home. All follow-up appointments have been scheduled. Discharge medications are listed below.    Discharge Vitals Blood pressure (!) 103/52, pulse 66, temperature 98.8 F (37.1 C), temperature source Oral, resp. rate 14, height 5\' 5"  (1.651 m), weight 191 lb 2.2 oz (86.7 kg), SpO2 94 %.  Filed Weights   04/25/18 1915  Weight: 191 lb 2.2 oz (86.7 kg)    Labs & Radiologic Studies     CBC Recent Labs    04/25/18 1357 04/26/18 0212  WBC 10.8* 8.0  HGB 12.0 11.8*  HCT 36.5 36.5  MCV 92.9 93.8  PLT 232 299   Basic Metabolic Panel Recent Labs    04/27/18 0330 04/28/18 0638  NA 139 136  K 3.6 3.3*  CL 108 104  CO2 22 21*  GLUCOSE 116* 106*  BUN 16 15  CREATININE 0.95 0.96  CALCIUM 8.7* 8.7*   Liver Function Tests Recent Labs    04/25/18 2150 04/26/18 0212  AST 211* 186*  ALT 47 47  ALKPHOS 64 67  BILITOT 0.6 0.7  PROT 6.5 6.7  ALBUMIN 3.1* 3.1*   Cardiac Enzymes Recent Labs    04/25/18 1357 04/25/18 2150 04/26/18 0212  TROPONINI >65.00* >65.00* >65.00*   Hemoglobin A1C Recent Labs    04/25/18 2150  HGBA1C 5.7*   Fasting Lipid Panel Recent Labs    04/27/18 0330  CHOL 158  HDL 30*  LDLCALC 88  TRIG 198*  CHOLHDL 5.3    Disposition   Pt is being discharged home today in good condition.  Follow-up Plans & Appointments    Follow-up Information    Imogene Burn, PA-C. Go on 05/09/2018.   Specialty:  Cardiology Why:  @2pm  for TCM follow up Contact information: [address] [place] [address] [phone]          Discharge Instructions    Amb Referral to Cardiac Rehabilitation   Complete by:  As directed    Diagnosis:   Coronary Stents PTCA STEMI     Diet - low sodium heart healthy   Complete by:  As directed    Discharge instructions   Complete by:  As directed    No driving for 2 weeks. No lifting over 10 lbs for 4 weeks. Keep procedure site clean &  dry. If you notice increased pain, swelling, bleeding or pus, call/return!  You may shower, but no soaking baths/hot tubs/pools for 1 week.   Increase activity slowly   Complete by:  As directed       Discharge Medications   Allergies as of 04/28/2018   No Known Allergies     Medication List    TAKE these medications   aspirin EC 81 MG tablet Take 1 tablet (81 mg total) by mouth daily.   atorvastatin 80 MG tablet Commonly known as:  LIPITOR Take 1 tablet (80 mg total) by mouth daily at 6 PM.   levothyroxine 75 MCG tablet Commonly known as:  SYNTHROID,  LEVOTHROID Take 75 mcg by mouth daily before breakfast.   metoprolol tartrate 25 MG tablet Commonly known as:  LOPRESSOR Take 0.5 tablets (12.5 mg total) by mouth 2 (two) times daily.   nitroGLYCERIN 0.4 MG SL tablet Commonly known as:  NITROSTAT Place 1 tablet (0.4 mg total) under the tongue every 5 (five) minutes as needed.   spironolactone 25 MG tablet Commonly known as:  ALDACTONE Take 0.5 tablets (12.5 mg total) by mouth daily. Start taking on:  04/29/2018   ticagrelor 90 MG Tabs tablet Commonly known as:  BRILINTA Take 1 tablet (90 mg total) by mouth 2 (two) times daily.        Aspirin prescribed at discharge?  Yes High Intensity Statin Prescribed? (Lipitor 40-80mg  or Crestor 20-40mg ): Yes Beta Blocker Prescribed? Yes For EF 45% or less, Was ACEI/ARB Prescribed? BP too soft to add ADP Receptor Inhibitor Prescribed? (i.e. Plavix etc.-Includes Medically Managed Patients): Yes For EF <40%, Aldosterone Inhibitor Prescribed? Yes Was EF assessed during THIS hospitalization? Yes Was Cardiac Rehab II ordered? (Included Medically managed Patients): Yes   Outstanding Labs/Studies   Consider OP f/u labs 6-8 weeks given statin initiation this admission. BMET at [facility] follow up  Duration of Discharge Encounter   Greater than 30 minutes including physician time.  Signed, Leanor Kail PA-C 04/28/2018, 1:34 PM    I have examined the patient and reviewed assessment and plan and discussed with patient.  Agree with above as stated.     CAD/Anterior MI: Doing well.  Acute systolic heart failure.  BP too low for ACE-I.  Spironolactone for diuretic and helping to increase LV function. High dose statin needed.  DAPT importance stresed.    Residual RCA disease to be managed medically at this time.  Plan d/c with HHPT.     Larae Grooms

## 2018-04-28 NOTE — Care Management Note (Signed)
Case Management Note  Patient Details  Name: April Yang MRN: [id number] Date of Birth: [birth year]  Subjective/Objective:                 Patient will DC to [place] [address]. Her daughter Danton Clap [phone] will care for her. They will be followed by [facility] for [facility] PT, referral accepted by Butch Penny, clinical liaison.    Action/Plan:   Expected Discharge Date:  04/28/18               Expected Discharge Plan:  [place]  In-House Referral:     Discharge planning Services  CM Consult  Post Acute Care Choice:  Home Health Choice offered to:  Adult Children  DME Arranged:    DME Agency:     HH Arranged:  PT HH Agency:  [facility]  Status of Service:  Completed, signed off  If discussed at [place] of Stay Meetings, dates discussed:    Additional Comments:  Carles Collet, RN 04/28/2018, 2:41 PM

## 2018-04-28 NOTE — Progress Notes (Signed)
Progress Note  Patient Name: April Yang Date of Encounter: 04/28/2018  Primary Cardiologist: No primary care provider on file.   Subjective   No CP or SHOB. Did well with PT.  Gilford Rile was used.  Inpatient Medications    Scheduled Meds: . aspirin  81 mg Oral Daily  . atorvastatin  80 mg Oral q1800  . levothyroxine  75 mcg Oral QAC breakfast  . metoprolol tartrate  12.5 mg Oral BID  . sodium chloride flush  3 mL Intravenous Q12H  . spironolactone  12.5 mg Oral Daily  . ticagrelor  90 mg Oral BID   Continuous Infusions: . sodium chloride     PRN Meds: sodium chloride, acetaminophen, ondansetron (ZOFRAN) IV, sodium chloride flush   Vital Signs    Vitals:   04/28/18 1000 04/28/18 1047 04/28/18 1100 04/28/18 1213  BP: (!) 108/53 (!) 108/53 (!) 111/59 (!) 103/52  Pulse: 65 67 62   Resp: 18  18   Temp:    98.8 F (37.1 C)  TempSrc:    Oral  SpO2: 98%  (!) 89%   Weight:      Height:        Intake/Output Summary (Last 24 hours) at 04/28/2018 1304 Last data filed at 04/28/2018 0045 Gross per 24 hour  Intake 240 ml  Output 200 ml  Net 40 ml   Filed Weights   04/25/18 1915  Weight: 191 lb 2.2 oz (86.7 kg)    Telemetry    NSR - Personally Reviewed  ECG      Physical Exam   GEN: No acute distress.   Neck: No JVD Cardiac: RRR, no murmurs, rubs, or gallops.  Respiratory: Clear to auscultation bilaterally. GI: Soft, nontender, non-distended  MS: No edema; No deformity. Right groin stable. No hematoma Neuro:  Nonfocal  Psych: Normal affect   Labs    Chemistry Recent Labs  Lab 04/25/18 2150 04/26/18 0212 04/27/18 0330 04/28/18 0638  NA 141 141 139 136  K 5.3* 4.8 3.6 3.3*  CL 108 108 108 104  CO2 23 24 22  21*  GLUCOSE 150* 150* 116* 106*  BUN 13 14 16 15   CREATININE 0.99 0.98 0.95 0.96  CALCIUM 8.7* 8.9 8.7* 8.7*  PROT 6.5 6.7  --   --   ALBUMIN 3.1* 3.1*  --   --   AST 211* 186*  --   --   ALT 47 47  --   --   ALKPHOS 64 67  --   --     BILITOT 0.6 0.7  --   --   GFRNONAA 47* 48* 50* 49*  GFRAA 55* 55* 58* 57*  ANIONGAP 10 9 9 11      Hematology Recent Labs  Lab 04/25/18 1236 04/25/18 1357 04/26/18 0212  WBC  --  10.8* 8.0  RBC  --  3.93 3.89  HGB 11.6* 12.0 11.8*  HCT 34.0* 36.5 36.5  MCV  --  92.9 93.8  MCH  --  30.5 30.3  MCHC  --  32.9 32.3  RDW  --  12.7 13.0  PLT  --  232 240    Cardiac Enzymes Recent Labs  Lab 04/25/18 1357 04/25/18 2150 04/26/18 0212  TROPONINI >65.00* >65.00* >65.00*   No results for input(s): TROPIPOC in the last 168 hours.   BNPNo results for input(s): BNP, PROBNP in the last 168 hours.   DDimer No results for input(s): DDIMER in the last 168 hours.   Radiology  No results found.  Cardiac Studies   Cath results reviewed.    Patient Profile     84 y.o. female s/p anterior MI  Assessment & Plan    1) CAD/Anterior MI: Doing well.  Acute systolic heart failure.  BP too low for ACE-I.  Spironolactone for diuretic and helping to increase LV function. High dose statin needed.  DAPT importance stresed.    Residual RCA disease to be managed medically at this time.  Plan d/c with HHPT.  For questions or updates, please contact [place] Please consult [email or web address] for contact info under Cardiology/STEMI.      Signed, Larae Grooms, MD  04/28/2018, 1:04 PM

## 2018-04-28 NOTE — Discharge Instructions (Signed)
Acute Coronary Syndrome °Acute coronary syndrome (ACS) is a serious problem in which there is suddenly not enough blood and oxygen reaching the heart. ACS can result in chest pain or a heart attack. °What are the causes? °This condition may be caused by: °· A buildup of fat and cholesterol inside of the arteries (atherosclerosis). This is the most common cause. The buildup (plaque) can cause the blood vessels in your heart (coronary arteries) to become narrow or blocked. Plaque can also break off to form a clot. °· A coronary spasm. °· A tearing of the coronary artery (spontaneous coronary artery dissection). °· Low blood pressure (hypotension). °· An abnormal heart beat (arrhythmia). °· Using cocaine or methamphetamine. ° °What increases the risk? °The following factors may make you more likely to develop this condition: °· Age. °· History of chest pain, heart attack, or stroke. °· Being female. °· Family history of chest pain, heart disease, or stroke. °· Smoking. °· Inactivity. °· Being overweight. °· High cholesterol. °· High blood pressure (hypertension). °· Diabetes. °· Excessive alcohol use. ° °What are the signs or symptoms? °Common symptoms of this condition include: °· Chest pain. The pain may last long, or may stop and come back (recur). It may feel like: °? Crushing or squeezing. °? Tightness, pressure, fullness, or heaviness. °· Arm, neck, jaw, or back pain. °· Heartburn or indigestion. °· Shortness of breath. °· Nausea. °· Sudden cold sweats. °· Lightheadedness. °· Dizziness. °· Tiredness (fatigue). ° °Sometimes there are no symptoms. °How is this diagnosed? °This condition may be diagnosed through: °· An electrocardiogram (ECG). This test records the impulses of the heart. °· Blood tests. °· A CT scan of the chest. °· A coronary angiogram. This procedure checks for a blockage in the coronary arteries. ° °How is this treated? °Treatment for this condition may include: °· Oxygen. °· Medicines, such  as: °? Antiplatelet medicines and blood-thinning medicines, such as aspirin. These help prevent blood clots. °? Fibrinolytic therapy. This breaks apart a blood clot. °? Blood pressure medicines. °? Nitroglycerin. °? Pain medicine. °? Cholesterol medicine. °· A procedure called coronary angioplasty and stenting. This is done to widen a narrowed artery and keep it open. °· Coronary artery bypass surgery. This allows blood to pass the blockage to reach your heart. °· Cardiac rehabilitation. This is a program that helps improve your health and well-being. It includes exercise training, education, and counseling to help you recover. ° °Follow these instructions at home: °Eating and drinking °· Follow a heart-healthy, low-salt (sodium) diet. °· Use healthy cooking methods such as roasting, grilling, broiling, baking, poaching, steaming, or stir-frying. °· Talk to a dietitian to learn about healthy cooking methods and how to eat less sodium. °Medicines °· Take over-the-counter and prescription medicines only as told by your health care provider. °· Do not take these medicines unless your health care provider approves: °? Nonsteroidal anti-inflammatory drugs (NSAIDs), such as ibuprofen, naproxen, or celecoxib. °? Vitamin supplements that contain vitamin A or vitamin E. °? Hormone replacement therapy that contains estrogen. °Activity °· Join a cardiac rehabilitation program. °· Ask your health care provider: °? What activities and exercises are safe for you. °? If you should follow specific instructions about lifting, driving, or climbing stairs. °· If you are taking aspirin and another blood thinning medicine, avoid activities that are likely to result in an injury. The medicines can increase your risk of bleeding. °Lifestyle °· Do not use any products that contain nicotine or tobacco, such as cigarettes   and e-cigarettes. If you need help quitting, ask your health care provider. °· If you drink alcohol and your health care  provider says it is okay to drink, limit your alcohol intake to no more than 1 drink per day. One drink equals 12 oz of beer, 5 oz of wine, or 1½ oz of hard liquor. °· Maintain a healthy weight. If you need to lose weight, do it in a way that has been approved by your health care provider. °General instructions °· Tell all your health care providers about your heart condition, including your dentist. Some medicines can increase your risk of arrhythmia. °· Manage other health conditions, such as hypertension and diabetes. These conditions affect your heart. °· Learn ways to manage stress. °· Get screened for depression, and seek treatment if needed. °· Monitor your blood pressure if told by your health care provider. °· Keep your vaccinations up to date. Get the annual influenza vaccine. °· Keep all follow-up visits as told by your health care provider. This is important. °Contact a health care provider if: °· You feel overwhelmed or sad. °· You have trouble with your daily activities. °Get help right away if: °· You have pain in your chest, neck, arm, jaw, stomach, or back that recurs, and: °? Lasts more than a few minutes. °? Is not relieved by taking the medicineyour health care provider prescribed. °· You have unexplained: °? Heavy sweating. °? Heartburn or indigestion. °? Shortness of breath. °? Difficulty breathing. °? Nausea or vomiting. °? Fatigue. °? Nervousness or anxiety. °? Weakness. °? Diarrhea. °? Dark stools or blood in the stool. °· You have sudden lightheadedness or dizziness. °· Your blood pressure is higher than 180/120 °· You faint. °· You feel like hurting yourself or think about taking your own life. °These symptoms may represent a serious problem that is an emergency. Do not wait to see if the symptoms will go away. Get medical help right away. Call your local emergency services (911 in the [place] Do not drive yourself to the clinic or hospital. °Summary °· Acute coronary syndrome (ACS) is a  when there is not enough blood and oxygen being supplied to the heart. ACS can result in chest pain or a heart attack. °· Acute coronary syndrome is a medical emergency. If you have any symptoms of this condition, get help right away. °· Treatment includes oxygen, medicines, and procedures to open the blocked arteries and restore blood flow. °This information is not intended to replace advice given to you by your health care provider. Make sure you discuss any questions you have with your health care provider. °Document Released: 11/15/2005 Document Revised: 12/17/2016 Document Reviewed: 12/17/2016 °Elsevier Interactive Patient Education © 2018 [employer] Inc. ° °

## 2018-05-04 DIAGNOSIS — I361 Nonrheumatic tricuspid (valve) insufficiency: Secondary | ICD-10-CM | POA: Diagnosis not present

## 2018-05-04 DIAGNOSIS — I25119 Atherosclerotic heart disease of native coronary artery with unspecified angina pectoris: Secondary | ICD-10-CM | POA: Diagnosis not present

## 2018-05-04 DIAGNOSIS — Z955 Presence of coronary angioplasty implant and graft: Secondary | ICD-10-CM | POA: Diagnosis not present

## 2018-05-04 DIAGNOSIS — E039 Hypothyroidism, unspecified: Secondary | ICD-10-CM | POA: Diagnosis not present

## 2018-05-04 DIAGNOSIS — Z48812 Encounter for surgical aftercare following surgery on the circulatory system: Secondary | ICD-10-CM | POA: Diagnosis not present

## 2018-05-04 DIAGNOSIS — Z7982 Long term (current) use of aspirin: Secondary | ICD-10-CM | POA: Diagnosis not present

## 2018-05-04 DIAGNOSIS — I255 Ischemic cardiomyopathy: Secondary | ICD-10-CM | POA: Diagnosis not present

## 2018-05-04 DIAGNOSIS — E785 Hyperlipidemia, unspecified: Secondary | ICD-10-CM | POA: Diagnosis not present

## 2018-05-04 DIAGNOSIS — R7303 Prediabetes: Secondary | ICD-10-CM | POA: Diagnosis not present

## 2018-05-04 DIAGNOSIS — I2109 ST elevation (STEMI) myocardial infarction involving other coronary artery of anterior wall: Secondary | ICD-10-CM | POA: Diagnosis not present

## 2018-05-04 DIAGNOSIS — I5041 Acute combined systolic (congestive) and diastolic (congestive) heart failure: Secondary | ICD-10-CM | POA: Diagnosis not present

## 2018-05-04 DIAGNOSIS — Z7902 Long term (current) use of antithrombotics/antiplatelets: Secondary | ICD-10-CM | POA: Diagnosis not present

## 2018-05-09 ENCOUNTER — Encounter: Payer: Self-pay | Admitting: Physician Assistant

## 2018-05-09 ENCOUNTER — Ambulatory Visit: Payer: Medicare Other | Admitting: Physician Assistant

## 2018-05-09 VITALS — BP 108/68 | HR 68 | Ht 65.0 in | Wt 162.0 lb

## 2018-05-09 DIAGNOSIS — I2510 Atherosclerotic heart disease of native coronary artery without angina pectoris: Secondary | ICD-10-CM

## 2018-05-09 DIAGNOSIS — I255 Ischemic cardiomyopathy: Secondary | ICD-10-CM | POA: Insufficient documentation

## 2018-05-09 DIAGNOSIS — E782 Mixed hyperlipidemia: Secondary | ICD-10-CM

## 2018-05-09 DIAGNOSIS — I5042 Chronic combined systolic (congestive) and diastolic (congestive) heart failure: Secondary | ICD-10-CM | POA: Diagnosis not present

## 2018-05-09 DIAGNOSIS — E785 Hyperlipidemia, unspecified: Secondary | ICD-10-CM | POA: Insufficient documentation

## 2018-05-09 NOTE — Patient Instructions (Addendum)
Medication Instructions:  Your physician recommends that you continue on your current medications as directed. Please refer to the Current Medication list given to you today.  Labwork: 6 WEEKS:  LIPID & LFT  Testing/Procedures: Your physician has requested that you have an echocardiogram IN 2 MONTHS.  Echocardiography is a painless test that uses sound waves to create images of your heart. It provides your doctor with information about the size and shape of your heart and how well your heart's chambers and valves are working. This procedure takes approximately one hour. There are no restrictions for this procedure.   Follow-Up: Your physician recommends that you schedule a follow-up appointment in:  3 MONTHS WITH DR. VARANASI   Any Other Special Instructions Will Be Listed Below (If Applicable).  Echocardiogram An echocardiogram, or echocardiography, uses sound waves (ultrasound) to produce an image of your heart. The echocardiogram is simple, painless, obtained within a short period of time, and offers valuable information to your health care provider. The images from an echocardiogram can provide information such as:  Evidence of coronary artery disease (CAD).  Heart size.  Heart muscle function.  Heart valve function.  Aneurysm detection.  Evidence of a past heart attack.  Fluid buildup around the heart.  Heart muscle thickening.  Assess heart valve function.  Tell a health care provider about:  Any allergies you have.  All medicines you are taking, including vitamins, herbs, eye drops, creams, and over-the-counter medicines.  Any problems you or family members have had with anesthetic medicines.  Any blood disorders you have.  Any surgeries you have had.  Any medical conditions you have.  Whether you are pregnant or may be pregnant. What happens before the procedure? No special preparation is needed. Eat and drink normally. What happens during the  procedure?  In order to produce an image of your heart, gel will be applied to your chest and a wand-like tool (transducer) will be moved over your chest. The gel will help transmit the sound waves from the transducer. The sound waves will harmlessly bounce off your heart to allow the heart images to be captured in real-time motion. These images will then be recorded.  You may need an IV to receive a medicine that improves the quality of the pictures. What happens after the procedure? You may return to your normal schedule including diet, activities, and medicines, unless your health care provider tells you otherwise. This information is not intended to replace advice given to you by your health care provider. Make sure you discuss any questions you have with your health care provider. Document Released: 11/12/2000 Document Revised: 07/03/2016 Document Reviewed: 07/23/2013 Elsevier Interactive Patient Education  2017 [employer].    If you need a refill on your cardiac medications before your next appointment, please call your pharmacy.

## 2018-05-09 NOTE — Progress Notes (Signed)
Cardiology Office Note    Date:  05/09/2018   ID:  Farrin, Shadle 15-Nov-1921, MRN [id number]  PCP:  Mateo Flow, MD  Cardiologist: Larae Grooms, MD  Chief Complaint  Patient presents with  . Hospitalization Follow-up    History of Present Illness:  April Yang is a 84 y.o. female with history of hypothyroidism and otherwise healthy week admitted 04/25/2018 with anterior STEMI treated with DES to the LAD x2.  Significant 70 to 75% RCA to be treated medically.  Ischemic cardiomyopathy LVEF 35 to 40% with grade 1 DD on 2D echo 04/26/2018.  Patient had heart failure in the hospital improved with Lasix.  Spironolactone was added.  Blood pressure was too soft to add ACE or ARB.  Patient comes in today accompanied by 2 of her daughters.  She has been having trouble swallowing the Lipitor and has a metallic taste in her mouth.  Not much of an appetite.  She does have physical therapy working with her and is gradually getting her strength and balance back.  She denies any chest pain, palpitations, dyspnea, dyspnea on exertion, dizziness or presyncope.  She is just not used to taking any medication.    Past Medical History:  Diagnosis Date  . Acute anterior wall MI ([place]) 04/25/2018  . CAD in native artery    a. cath 100% pLAD s/p DES x2. medical managment of 70% RCA  . HLD (hyperlipidemia)   . Hypothyroidism   . Ischemic cardiomyopathy     Past Surgical History:  Procedure Laterality Date  . CORONARY STENT INTERVENTION N/A 04/25/2018   Procedure: CORONARY STENT INTERVENTION;  Surgeon: Jettie Booze, MD;  Location: [facility];  Service: Cardiovascular;  Laterality: N/A;  . LEFT HEART CATH AND CORONARY ANGIOGRAPHY N/A 04/25/2018   Procedure: LEFT HEART CATH AND CORONARY ANGIOGRAPHY;  Surgeon: Jettie Booze, MD;  Location: [facility];  Service: Cardiovascular;  Laterality: N/A;    Current Medications: Current Meds  Medication Sig  . aspirin EC 81  MG tablet Take 1 tablet (81 mg total) by mouth daily.  Marland Kitchen atorvastatin (LIPITOR) 80 MG tablet Take 1 tablet (80 mg total) by mouth daily at 6 PM.  . levothyroxine (SYNTHROID, LEVOTHROID) 75 MCG tablet Take 75 mcg by mouth daily before breakfast.  . metoprolol tartrate (LOPRESSOR) 25 MG tablet Take 0.5 tablets (12.5 mg total) by mouth 2 (two) times daily.  . nitroGLYCERIN (NITROSTAT) 0.4 MG SL tablet Place 1 tablet (0.4 mg total) under the tongue every 5 (five) minutes as needed.  Marland Kitchen spironolactone (ALDACTONE) 25 MG tablet Take 0.5 tablets (12.5 mg total) by mouth daily.  . ticagrelor (BRILINTA) 90 MG TABS tablet Take 1 tablet (90 mg total) by mouth 2 (two) times daily.     Allergies:   Patient has no known allergies.   Social History   Socioeconomic History  . Marital status: Married    Spouse name: Not on file  . Number of children: Not on file  . Years of education: Not on file  . Highest education level: Not on file  Occupational History  . Not on file  Social Needs  . Financial resource strain: Not on file  . Food insecurity:    Worry: Not on file    Inability: Not on file  . Transportation needs:    Medical: Not on file    Non-medical: Not on file  Tobacco Use  . Smoking status: Never Smoker  .  Smokeless tobacco: Never Used  Substance and Sexual Activity  . Alcohol use: Not on file  . Drug use: Not on file  . Sexual activity: Not on file  Lifestyle  . Physical activity:    Days per week: Not on file    Minutes per session: Not on file  . Stress: Not on file  Relationships  . Social connections:    Talks on phone: Not on file    Gets together: Not on file    Attends religious service: Not on file    Active member of club or organization: Not on file    Attends meetings of clubs or organizations: Not on file    Relationship status: Not on file  Other Topics Concern  . Not on file  Social History Narrative  . Not on file     Family History:  The patient's family  history includes Aneurysm in her mother; Breast cancer in her sister.   ROS:   Please see the history of present illness.    Review of Systems  Constitution: Positive for decreased appetite and malaise/fatigue.  HENT: Negative.   Eyes: Negative.   Cardiovascular: Negative.   Respiratory: Negative.   Hematologic/Lymphatic: Negative.   Musculoskeletal: Negative.  Negative for joint pain.  Gastrointestinal: Negative.   Genitourinary: Negative.   Neurological: Positive for loss of balance and weakness.   All other systems reviewed and are negative.   PHYSICAL EXAM:   VS:  BP 108/68   Pulse 68   Ht 5\' 5"  (1.651 m)   Wt 162 lb (73.5 kg)   SpO2 96%   BMI 26.96 kg/m   Physical Exam  GEN: Well nourished, well developed, in no acute distress  Neck: no JVD, carotid bruits, or masses Cardiac:RRR; no murmurs, rubs, or gallops  Respiratory:  clear to auscultation bilaterally, normal work of breathing GI: soft, nontender, nondistended, + BS Ext: Right groin without hematoma or hemorrhage without cyanosis, clubbing, or edema, Good distal pulses bilaterally Neuro:  Alert and Oriented x 3 Psych: euthymic mood, full affect  Wt Readings from Last 3 Encounters:  05/09/18 162 lb (73.5 kg)  04/25/18 191 lb 2.2 oz (86.7 kg)      Studies/Labs Reviewed:   EKG:  EKG is  ordered today.  The ekg ordered today demonstrates normal sinus rhythm with recent anterior septal MI with T wave inversion anterior lateral  Recent Labs: 04/26/2018: ALT 47; Hemoglobin 11.8; Platelets 240 04/28/2018: BUN 15; Creatinine, Ser 0.96; Potassium 3.3; Sodium 136   Lipid Panel    Component Value Date/Time   CHOL 158 04/27/2018 0330   TRIG 198 (H) 04/27/2018 0330   HDL 30 (L) 04/27/2018 0330   CHOLHDL 5.3 04/27/2018 0330   VLDL 40 04/27/2018 0330   LDLCALC 88 04/27/2018 0330    Additional studies/ records that were reviewed today include:    Echo 04/26/2018 Study Conclusions   - Left ventricle: The cavity  size was normal. Wall thickness was   normal. Systolic function was moderately reduced. The estimated   ejection fraction was in the range of 35% to 40%. Severe   anterior, anteroseptal and apical and inferoapical hypokinesis,   suggestive of LAD territory ischemia/infarct. Doppler parameters   are consistent with abnormal left ventricular relaxation (grade 1   diastolic dysfunction). The E/e&' ratio is >15, suggesting   elevated LV filling pressure. - Mitral valve: Mildly thickened leaflets . There was trivial   regurgitation. - Left atrium: The atrium was normal in  size. - Right ventricle: The cavity size was normal. Wall thickness was   normal. Systolic function is mildly reduced. - Tricuspid valve: There was mild regurgitation. - Pulmonary arteries: PA peak pressure: 30 mm Hg (S). - Inferior vena cava: The vessel was dilated. The respirophasic   diameter changes were blunted (< 50%), consistent with elevated   central venous pressure. - Pericardium, extracardiac: A trivial pericardial effusion was   identified posterior to the heart.   Impressions:   - LVEF 35-40%, normal wall thickness, severe anterior,   anteroseptal, apical and inferoapical hypokinesis suggestive of   LAD territory ischemia/infarct, grade 1 DD with elevated LV   filling pressure, trivial MR, normal LA size, mildly reduced RV   systolic function, mild TR, RVSP 30 mmHg, dilated IVC, trivial   posterior pericardial effusion.   LEFT HEART CATH AND CORONARY ANGIOGRAPHY 04/25/2018  Conclusion     Prox RCA lesion is 75% stenosed.  Mid Cx lesion is 25% stenosed.  LV end diastolic pressure is moderately elevated. LVEDP 23 mm Hg.  There is no aortic valve stenosis.  Prox LAD lesion is 100% stenosed.  A stent was successfully placed using a STENT SYNERGY DES 3X12.  A drug-eluting stent was successfully placed using a STENT SYNERGY DES 2.75X20.  Post intervention, there is a 0% residual stenosis.          ASSESSMENT:    1. Coronary artery disease involving native coronary artery of native heart without angina pectoris   2. Ischemic cardiomyopathy   3. Mixed hyperlipidemia   4. Chronic combined systolic and diastolic CHF (congestive heart failure) (HCC)      PLAN:  In order of problems listed above:  CAD status post acute anterior wall MI treated with stenting to the LAD 04/25/2018 with residual 75% RCA treated medically.  Continue Brilinta and aspirin and statin.  Try to take with food to increase her appetite.  No bleeding problems.  Follow-up with Dr. Irish Lack in 2 months.  Ischemic cardiomyopathy ejection fraction 35 to 40% on 2D echo.  No evidence of heart failure on exam.  Continue Spironolactone.  2 g sodium diet.  Check labs because potassium was 3.3 last time checked 04/28/2018. 2D echo in 3 months.  Mixed hyperlipidemia on Lipitor.  Patient is having trouble swallowing the pill.  The daughters are cutting it into pieces and she is tolerating it so far.  They will let us know if she does not tolerate it.  Chronic combined systolic and diastolic CHF ejection fraction 35 to 40% with grade 1 DD.  Currently compensated without heart failure.  Continue 2 g sodium diet and Spironolactone.   Medication Adjustments/Labs and Tests Ordered: Current medicines are reviewed at length with the patient today.  Concerns regarding medicines are outlined above.  Medication changes, Labs and Tests ordered today are listed in the Patient Instructions below. Patient Instructions  Medication Instructions:  Your physician recommends that you continue on your current medications as directed. Please refer to the Current Medication list given to you today.  Labwork: 6 WEEKS:  LIPID & LFT  Testing/Procedures: Your physician has requested that you have an echocardiogram IN 2 MONTHS.  Echocardiography is a painless test that uses sound waves to create images of your heart. It provides your doctor  with information about the size and shape of your heart and how well your heart's chambers and valves are working. This procedure takes approximately one hour. There are no restrictions for this procedure.  Follow-Up: Your physician recommends that you schedule a follow-up appointment in:  3 MONTHS WITH DR. VARANASI   Any Other Special Instructions Will Be Listed Below (If Applicable).  Echocardiogram An echocardiogram, or echocardiography, uses sound waves (ultrasound) to produce an image of your heart. The echocardiogram is simple, painless, obtained within a short period of time, and offers valuable information to your health care provider. The images from an echocardiogram can provide information such as:  Evidence of coronary artery disease (CAD).  Heart size.  Heart muscle function.  Heart valve function.  Aneurysm detection.  Evidence of a past heart attack.  Fluid buildup around the heart.  Heart muscle thickening.  Assess heart valve function.  Tell a health care provider about:  Any allergies you have.  All medicines you are taking, including vitamins, herbs, eye drops, creams, and over-the-counter medicines.  Any problems you or family members have had with anesthetic medicines.  Any blood disorders you have.  Any surgeries you have had.  Any medical conditions you have.  Whether you are pregnant or may be pregnant. What happens before the procedure? No special preparation is needed. Eat and drink normally. What happens during the procedure?  In order to produce an image of your heart, gel will be applied to your chest and a wand-like tool (transducer) will be moved over your chest. The gel will help transmit the sound waves from the transducer. The sound waves will harmlessly bounce off your heart to allow the heart images to be captured in real-time motion. These images will then be recorded.  You may need an IV to receive a medicine that improves the  quality of the pictures. What happens after the procedure? You may return to your normal schedule including diet, activities, and medicines, unless your health care provider tells you otherwise. This information is not intended to replace advice given to you by your health care provider. Make sure you discuss any questions you have with your health care provider. Document Released: 11/12/2000 Document Revised: 07/03/2016 Document Reviewed: 07/23/2013 Elsevier Interactive Patient Education  2017 [employer].    If you need a refill on your cardiac medications before your next appointment, please call your pharmacy.      Sumner Boast, PA-C  05/09/2018 3:07 PM    [facility] [place], [place], [place]  [address] Phone: [phone]; Fax: [phone]

## 2018-05-10 LAB — BASIC METABOLIC PANEL
BUN/Creatinine Ratio: 13 (ref 12–28)
BUN: 11 mg/dL (ref 10–36)
CALCIUM: 8.8 mg/dL (ref 8.7–10.3)
CO2: 19 mmol/L — AB (ref 20–29)
CREATININE: 0.88 mg/dL (ref 0.57–1.00)
Chloride: 103 mmol/L (ref 96–106)
GFR calc Af Amer: 65 mL/min/{1.73_m2} (ref >59)
GFR, EST NON AFRICAN AMERICAN: 56 mL/min/{1.73_m2} — AB (ref >59)
Glucose: 102 mg/dL — ABNORMAL HIGH (ref 65–99)
Potassium: 4.6 mmol/L (ref 3.5–5.2)
Sodium: 137 mmol/L (ref 134–144)

## 2018-05-16 ENCOUNTER — Encounter: Payer: Self-pay | Admitting: *Deleted

## 2018-05-18 DIAGNOSIS — I2109 ST elevation (STEMI) myocardial infarction involving other coronary artery of anterior wall: Secondary | ICD-10-CM | POA: Diagnosis not present

## 2018-05-18 DIAGNOSIS — I361 Nonrheumatic tricuspid (valve) insufficiency: Secondary | ICD-10-CM | POA: Diagnosis not present

## 2018-05-18 DIAGNOSIS — Z7982 Long term (current) use of aspirin: Secondary | ICD-10-CM | POA: Diagnosis not present

## 2018-05-18 DIAGNOSIS — Z955 Presence of coronary angioplasty implant and graft: Secondary | ICD-10-CM | POA: Diagnosis not present

## 2018-05-18 DIAGNOSIS — E039 Hypothyroidism, unspecified: Secondary | ICD-10-CM | POA: Diagnosis not present

## 2018-05-18 DIAGNOSIS — Z7902 Long term (current) use of antithrombotics/antiplatelets: Secondary | ICD-10-CM | POA: Diagnosis not present

## 2018-05-18 DIAGNOSIS — Z48812 Encounter for surgical aftercare following surgery on the circulatory system: Secondary | ICD-10-CM | POA: Diagnosis not present

## 2018-05-18 DIAGNOSIS — R7303 Prediabetes: Secondary | ICD-10-CM | POA: Diagnosis not present

## 2018-05-18 DIAGNOSIS — I5041 Acute combined systolic (congestive) and diastolic (congestive) heart failure: Secondary | ICD-10-CM | POA: Diagnosis not present

## 2018-05-18 DIAGNOSIS — E785 Hyperlipidemia, unspecified: Secondary | ICD-10-CM | POA: Diagnosis not present

## 2018-05-18 DIAGNOSIS — I25119 Atherosclerotic heart disease of native coronary artery with unspecified angina pectoris: Secondary | ICD-10-CM | POA: Diagnosis not present

## 2018-05-18 DIAGNOSIS — I255 Ischemic cardiomyopathy: Secondary | ICD-10-CM | POA: Diagnosis not present

## 2018-05-23 DIAGNOSIS — E785 Hyperlipidemia, unspecified: Secondary | ICD-10-CM | POA: Diagnosis not present

## 2018-05-23 DIAGNOSIS — Z48812 Encounter for surgical aftercare following surgery on the circulatory system: Secondary | ICD-10-CM | POA: Diagnosis not present

## 2018-05-23 DIAGNOSIS — I255 Ischemic cardiomyopathy: Secondary | ICD-10-CM | POA: Diagnosis not present

## 2018-05-23 DIAGNOSIS — E039 Hypothyroidism, unspecified: Secondary | ICD-10-CM | POA: Diagnosis not present

## 2018-05-23 DIAGNOSIS — I361 Nonrheumatic tricuspid (valve) insufficiency: Secondary | ICD-10-CM | POA: Diagnosis not present

## 2018-05-23 DIAGNOSIS — R7303 Prediabetes: Secondary | ICD-10-CM | POA: Diagnosis not present

## 2018-05-23 DIAGNOSIS — Z7982 Long term (current) use of aspirin: Secondary | ICD-10-CM | POA: Diagnosis not present

## 2018-05-23 DIAGNOSIS — I2109 ST elevation (STEMI) myocardial infarction involving other coronary artery of anterior wall: Secondary | ICD-10-CM | POA: Diagnosis not present

## 2018-05-23 DIAGNOSIS — Z955 Presence of coronary angioplasty implant and graft: Secondary | ICD-10-CM | POA: Diagnosis not present

## 2018-05-23 DIAGNOSIS — I25119 Atherosclerotic heart disease of native coronary artery with unspecified angina pectoris: Secondary | ICD-10-CM | POA: Diagnosis not present

## 2018-05-23 DIAGNOSIS — Z7902 Long term (current) use of antithrombotics/antiplatelets: Secondary | ICD-10-CM | POA: Diagnosis not present

## 2018-05-23 DIAGNOSIS — I5041 Acute combined systolic (congestive) and diastolic (congestive) heart failure: Secondary | ICD-10-CM | POA: Diagnosis not present

## 2018-05-25 DIAGNOSIS — I2109 ST elevation (STEMI) myocardial infarction involving other coronary artery of anterior wall: Secondary | ICD-10-CM | POA: Diagnosis not present

## 2018-05-25 DIAGNOSIS — E039 Hypothyroidism, unspecified: Secondary | ICD-10-CM | POA: Diagnosis not present

## 2018-05-25 DIAGNOSIS — I25119 Atherosclerotic heart disease of native coronary artery with unspecified angina pectoris: Secondary | ICD-10-CM | POA: Diagnosis not present

## 2018-05-25 DIAGNOSIS — I255 Ischemic cardiomyopathy: Secondary | ICD-10-CM | POA: Diagnosis not present

## 2018-05-25 DIAGNOSIS — I5041 Acute combined systolic (congestive) and diastolic (congestive) heart failure: Secondary | ICD-10-CM | POA: Diagnosis not present

## 2018-05-25 DIAGNOSIS — R7303 Prediabetes: Secondary | ICD-10-CM | POA: Diagnosis not present

## 2018-05-25 DIAGNOSIS — Z48812 Encounter for surgical aftercare following surgery on the circulatory system: Secondary | ICD-10-CM | POA: Diagnosis not present

## 2018-05-25 DIAGNOSIS — Z955 Presence of coronary angioplasty implant and graft: Secondary | ICD-10-CM | POA: Diagnosis not present

## 2018-05-25 DIAGNOSIS — I361 Nonrheumatic tricuspid (valve) insufficiency: Secondary | ICD-10-CM | POA: Diagnosis not present

## 2018-05-25 DIAGNOSIS — E785 Hyperlipidemia, unspecified: Secondary | ICD-10-CM | POA: Diagnosis not present

## 2018-05-25 DIAGNOSIS — Z7982 Long term (current) use of aspirin: Secondary | ICD-10-CM | POA: Diagnosis not present

## 2018-05-25 DIAGNOSIS — Z7902 Long term (current) use of antithrombotics/antiplatelets: Secondary | ICD-10-CM | POA: Diagnosis not present

## 2018-05-30 DIAGNOSIS — Z48812 Encounter for surgical aftercare following surgery on the circulatory system: Secondary | ICD-10-CM | POA: Diagnosis not present

## 2018-05-30 DIAGNOSIS — Z955 Presence of coronary angioplasty implant and graft: Secondary | ICD-10-CM | POA: Diagnosis not present

## 2018-05-30 DIAGNOSIS — E785 Hyperlipidemia, unspecified: Secondary | ICD-10-CM | POA: Diagnosis not present

## 2018-05-30 DIAGNOSIS — I255 Ischemic cardiomyopathy: Secondary | ICD-10-CM | POA: Diagnosis not present

## 2018-05-30 DIAGNOSIS — I2109 ST elevation (STEMI) myocardial infarction involving other coronary artery of anterior wall: Secondary | ICD-10-CM | POA: Diagnosis not present

## 2018-05-30 DIAGNOSIS — Z7902 Long term (current) use of antithrombotics/antiplatelets: Secondary | ICD-10-CM | POA: Diagnosis not present

## 2018-05-30 DIAGNOSIS — R7303 Prediabetes: Secondary | ICD-10-CM | POA: Diagnosis not present

## 2018-05-30 DIAGNOSIS — I5041 Acute combined systolic (congestive) and diastolic (congestive) heart failure: Secondary | ICD-10-CM | POA: Diagnosis not present

## 2018-05-30 DIAGNOSIS — E039 Hypothyroidism, unspecified: Secondary | ICD-10-CM | POA: Diagnosis not present

## 2018-05-30 DIAGNOSIS — I25119 Atherosclerotic heart disease of native coronary artery with unspecified angina pectoris: Secondary | ICD-10-CM | POA: Diagnosis not present

## 2018-05-30 DIAGNOSIS — Z7982 Long term (current) use of aspirin: Secondary | ICD-10-CM | POA: Diagnosis not present

## 2018-05-30 DIAGNOSIS — I361 Nonrheumatic tricuspid (valve) insufficiency: Secondary | ICD-10-CM | POA: Diagnosis not present

## 2018-06-01 DIAGNOSIS — I255 Ischemic cardiomyopathy: Secondary | ICD-10-CM | POA: Diagnosis not present

## 2018-06-01 DIAGNOSIS — Z7982 Long term (current) use of aspirin: Secondary | ICD-10-CM | POA: Diagnosis not present

## 2018-06-01 DIAGNOSIS — Z48812 Encounter for surgical aftercare following surgery on the circulatory system: Secondary | ICD-10-CM | POA: Diagnosis not present

## 2018-06-01 DIAGNOSIS — I5041 Acute combined systolic (congestive) and diastolic (congestive) heart failure: Secondary | ICD-10-CM | POA: Diagnosis not present

## 2018-06-01 DIAGNOSIS — I2109 ST elevation (STEMI) myocardial infarction involving other coronary artery of anterior wall: Secondary | ICD-10-CM | POA: Diagnosis not present

## 2018-06-01 DIAGNOSIS — I361 Nonrheumatic tricuspid (valve) insufficiency: Secondary | ICD-10-CM | POA: Diagnosis not present

## 2018-06-01 DIAGNOSIS — E785 Hyperlipidemia, unspecified: Secondary | ICD-10-CM | POA: Diagnosis not present

## 2018-06-01 DIAGNOSIS — Z7902 Long term (current) use of antithrombotics/antiplatelets: Secondary | ICD-10-CM | POA: Diagnosis not present

## 2018-06-01 DIAGNOSIS — I25119 Atherosclerotic heart disease of native coronary artery with unspecified angina pectoris: Secondary | ICD-10-CM | POA: Diagnosis not present

## 2018-06-01 DIAGNOSIS — E039 Hypothyroidism, unspecified: Secondary | ICD-10-CM | POA: Diagnosis not present

## 2018-06-01 DIAGNOSIS — R7303 Prediabetes: Secondary | ICD-10-CM | POA: Diagnosis not present

## 2018-06-01 DIAGNOSIS — Z955 Presence of coronary angioplasty implant and graft: Secondary | ICD-10-CM | POA: Diagnosis not present

## 2018-06-15 ENCOUNTER — Other Ambulatory Visit: Payer: Self-pay | Admitting: *Deleted

## 2018-06-15 DIAGNOSIS — I2510 Atherosclerotic heart disease of native coronary artery without angina pectoris: Secondary | ICD-10-CM

## 2018-06-15 DIAGNOSIS — E782 Mixed hyperlipidemia: Secondary | ICD-10-CM

## 2018-06-20 ENCOUNTER — Other Ambulatory Visit: Payer: Medicare Other

## 2018-06-20 ENCOUNTER — Encounter (INDEPENDENT_AMBULATORY_CARE_PROVIDER_SITE_OTHER): Payer: Self-pay

## 2018-06-20 DIAGNOSIS — I2510 Atherosclerotic heart disease of native coronary artery without angina pectoris: Secondary | ICD-10-CM | POA: Diagnosis not present

## 2018-06-20 DIAGNOSIS — E782 Mixed hyperlipidemia: Secondary | ICD-10-CM

## 2018-06-20 LAB — LIPID PANEL
Chol/HDL Ratio: 2.5 ratio (ref 0.0–4.4)
Cholesterol, Total: 95 mg/dL — ABNORMAL LOW (ref 100–199)
HDL: 38 mg/dL — AB (ref >39)
LDL Calculated: 32 mg/dL (ref 0–99)
Triglycerides: 125 mg/dL (ref 0–149)
VLDL Cholesterol Cal: 25 mg/dL (ref 5–40)

## 2018-06-20 LAB — HEPATIC FUNCTION PANEL
ALK PHOS: 119 IU/L — AB (ref 39–117)
ALT: 21 IU/L (ref 0–32)
AST: 32 IU/L (ref 0–40)
Albumin: 4 g/dL (ref 3.2–4.6)
BILIRUBIN, DIRECT: 0.16 mg/dL (ref 0.00–0.40)
Bilirubin Total: 0.5 mg/dL (ref 0.0–1.2)
TOTAL PROTEIN: 6.7 g/dL (ref 6.0–8.5)

## 2018-07-10 ENCOUNTER — Other Ambulatory Visit: Payer: Self-pay

## 2018-07-10 ENCOUNTER — Ambulatory Visit (HOSPITAL_COMMUNITY): Payer: Medicare Other | Attending: Cardiology

## 2018-07-10 DIAGNOSIS — E782 Mixed hyperlipidemia: Secondary | ICD-10-CM | POA: Diagnosis not present

## 2018-07-10 DIAGNOSIS — I252 Old myocardial infarction: Secondary | ICD-10-CM | POA: Insufficient documentation

## 2018-07-10 DIAGNOSIS — I2510 Atherosclerotic heart disease of native coronary artery without angina pectoris: Secondary | ICD-10-CM

## 2018-07-10 DIAGNOSIS — I5042 Chronic combined systolic (congestive) and diastolic (congestive) heart failure: Secondary | ICD-10-CM | POA: Diagnosis not present

## 2018-07-10 DIAGNOSIS — I255 Ischemic cardiomyopathy: Secondary | ICD-10-CM

## 2018-07-10 DIAGNOSIS — I081 Rheumatic disorders of both mitral and tricuspid valves: Secondary | ICD-10-CM | POA: Insufficient documentation

## 2018-07-10 MED ORDER — PERFLUTREN LIPID MICROSPHERE
1.0000 mL | INTRAVENOUS | Status: AC | PRN
  Administered 2018-07-10: 1 mL via INTRAVENOUS

## 2018-08-14 DIAGNOSIS — S335XXA Sprain of ligaments of lumbar spine, initial encounter: Secondary | ICD-10-CM | POA: Diagnosis not present

## 2018-08-14 DIAGNOSIS — S233XXA Sprain of ligaments of thoracic spine, initial encounter: Secondary | ICD-10-CM | POA: Diagnosis not present

## 2018-08-21 ENCOUNTER — Telehealth: Payer: Self-pay

## 2018-08-21 NOTE — Telephone Encounter (Signed)
I spoke to patient's daughter Danton Clap [phone]) who said that the patient developed a nose bleed last night and also had an area on her arm that she bumped and it started bleeding.  She is taking Brilinta 90 mg bid and Aspirin 81 mg daily since the end of May.  This is the first bleeding experience that she has encountered.  Please advise, thank you

## 2018-08-21 NOTE — Telephone Encounter (Signed)
If they were able to control the bleeding without going to ER she needs to continue current treatment. This is not uncommon on brillinta and ASA but she just had an MI with stents in May so it's important to try to stay on these meds.

## 2018-08-21 NOTE — Telephone Encounter (Signed)
Called and spoke to patient's daughter. She states that her nosebleed was easily controlled with holding pressure and did not last very long. The same with the bleeding when she bumped her arm. She states that the patient is not having any S/Sx of blood in her urine or stool. She states that she was just concerned as this was the first time that it happened. Made her aware that this can happen while taking both ASA and brilinta together. Made her aware that since it was easily controlled and she is not having any other S/Sx of bleeding she should continue taking both especially since she just had her stents placed in May. Instructed for her to let us know if she develops any other S/Sx of bleeding. She verbalized understanding and thanked me for the call.

## 2018-09-06 NOTE — Progress Notes (Signed)
Cardiology Office Note   Date:  09/07/2018   ID:  April Yang, April Yang 14-Dec-1920, MRN [id number]  PCP:  Mateo Flow, MD    No chief complaint on file.  CAD  Wt Readings from Last 3 Encounters:  09/07/18 148 lb 6.4 oz (67.3 kg)  05/09/18 162 lb (73.5 kg)  04/25/18 191 lb 2.2 oz (86.7 kg)       History of Present Illness: April Yang is a 84 y.o. female  Who was admitted 04/25/2018 with anterior STEMI treated with DES to the LAD x2.  Significant 70 to 75% RCA to be treated medically.  Ischemic cardiomyopathy LVEF 35 to 40% with grade 1 DD on 2D echo 04/26/2018.  Patient had heart failure in the hospital improved with Lasix.  Spironolactone was added.  Blood pressure was too soft to add ACE or ARB.   She had trouble swallowing the Lipitor and has a metallic taste in her mouth and reduced appetite.  She did have physical therapy working with her and this helped strength and balance.   8/19 echo showed:  - Normal LV size with EF 45%. Mid to apical anteroseptal akinesis,   apical anterior akinesis. Normal RV size and systolic function.   There was moderate mitral regurgitation.  Denies : Chest pain. Dizziness. Leg edema. Nitroglycerin use. Orthopnea. Palpitations. Paroxysmal nocturnal dyspnea. Shortness of breath. Syncope.   She has had issues with back pain.  She had muscle spasms.   Reports that she is "slowing down."  No bleeding problems. Occasional lightheadedness.   Lost sense of taste/decreased appetite.  Past Medical History:  Diagnosis Date  . Acute anterior wall MI ([place]) 04/25/2018  . CAD in native artery    a. cath 100% pLAD s/p DES x2. medical managment of 70% RCA  . HLD (hyperlipidemia)   . Hypothyroidism   . Ischemic cardiomyopathy     Past Surgical History:  Procedure Laterality Date  . CORONARY STENT INTERVENTION N/A 04/25/2018   Procedure: CORONARY STENT INTERVENTION;  Surgeon: Jettie Booze, MD;  Location: [facility];  Service:  Cardiovascular;  Laterality: N/A;  . LEFT HEART CATH AND CORONARY ANGIOGRAPHY N/A 04/25/2018   Procedure: LEFT HEART CATH AND CORONARY ANGIOGRAPHY;  Surgeon: Jettie Booze, MD;  Location: [facility];  Service: Cardiovascular;  Laterality: N/A;     Current Outpatient Medications  Medication Sig Dispense Refill  . aspirin EC 81 MG tablet Take 1 tablet (81 mg total) by mouth daily.    Marland Kitchen atorvastatin (LIPITOR) 80 MG tablet Take 1 tablet (80 mg total) by mouth daily at 6 PM. 30 tablet 6  . levothyroxine (SYNTHROID, LEVOTHROID) 75 MCG tablet Take 75 mcg by mouth daily before breakfast.  3  . metoprolol tartrate (LOPRESSOR) 25 MG tablet Take 0.5 tablets (12.5 mg total) by mouth 2 (two) times daily. 30 tablet 6  . nitroGLYCERIN (NITROSTAT) 0.4 MG SL tablet Place 1 tablet (0.4 mg total) under the tongue every 5 (five) minutes as needed. 25 tablet 12  . spironolactone (ALDACTONE) 25 MG tablet Take 0.5 tablets (12.5 mg total) by mouth daily. 30 tablet 6  . ticagrelor (BRILINTA) 90 MG TABS tablet Take 1 tablet (90 mg total) by mouth 2 (two) times daily. 60 tablet 11  . traMADol (ULTRAM) 50 MG tablet Take 1 tablet by mouth as needed.     No current facility-administered medications for this visit.     Allergies:   Patient has no  known allergies.    Social History:  The patient  reports that she has never smoked. She has never used smokeless tobacco.   Family History:  The patient's family history includes Aneurysm in her mother; Breast cancer in her sister.    ROS:  Please see the history of present illness.   Otherwise, review of systems are positive for easy bruising.   All other systems are reviewed and negative.    PHYSICAL EXAM: VS:  BP 100/70   Pulse (!) 53   Ht '5\' 5"'$  (1.651 m)   Wt 148 lb 6.4 oz (67.3 kg)   SpO2 97%   BMI 24.70 kg/m  , BMI Body mass index is 24.7 kg/m. GEN: Well nourished, well developed, in no acute distress , frail HEENT: normal  Neck: no JVD,  carotid bruits, or masses Cardiac: RRR; no murmurs, rubs, or gallops,no edema  Respiratory:  clear to auscultation bilaterally, normal work of breathing GI: soft, nontender, nondistended, + BS MS: no deformity or atrophy  Skin: warm and dry, no rash Neuro:  Strength and sensation are intact Psych: euthymic mood, full affect    Recent Labs: 04/26/2018: Hemoglobin 11.8; Platelets 240 05/09/2018: BUN 11; Creatinine, Ser 0.88; Potassium 4.6; Sodium 137 06/20/2018: ALT 21   Lipid Panel    Component Value Date/Time   CHOL 95 (L) 06/20/2018 0939   TRIG 125 06/20/2018 0939   HDL 38 (L) 06/20/2018 0939   CHOLHDL 2.5 06/20/2018 0939   CHOLHDL 5.3 04/27/2018 0330   VLDL 40 04/27/2018 0330   LDLCALC 32 06/20/2018 0939     Other studies Reviewed: Additional studies/ records that were reviewed today with results demonstrating: cath record reviewed.   ASSESSMENT AND PLAN:  1. CAD: Residual RCA lesion, managed medically. Stop aspirin.  Continue Brilinta.  Continue monotherapy for now.  Can switch to clopidogrel 75 mg daily if there are cost issues or bleeding issues.  Will certainly switch to clopidogrel in May 2020.  2. Ischemic cardiomyopathy: Improved.  Lost taste after starting meds from MI.  They think it may be aldactone.  3. Hyperlipidemia: LDL 32 in 7/19. Alk phos was up at last visit, recheck LFTs. BMet as well.  4. Chronic combined systolic heart failure: appears euvolemic.  5. Mitral regurgitation: No CHF. 6. Stop aldactone.  If BP increases, add low dose ACE-I instead.     Current medicines are reviewed at length with the patient today.  The patient concerns regarding her medicines were addressed.  The following changes have been made:  Stop aspirin, stop aldactone  Labs/ tests ordered today include:  No orders of the defined types were placed in this encounter.   Recommend 150 minutes/week of aerobic exercise Low fat, low carb, high fiber diet recommended  Disposition:    FU in 1 year   Signed, Larae Grooms, MD  09/07/2018 9:53 AM    [facility] [place], [place], [place]  [address] Phone: [phone]; Fax: [phone]

## 2018-09-07 ENCOUNTER — Encounter: Payer: Self-pay | Admitting: Interventional Cardiology

## 2018-09-07 ENCOUNTER — Ambulatory Visit: Payer: Medicare Other | Admitting: Interventional Cardiology

## 2018-09-07 VITALS — BP 100/70 | HR 53 | Ht 65.0 in | Wt 148.4 lb

## 2018-09-07 DIAGNOSIS — E782 Mixed hyperlipidemia: Secondary | ICD-10-CM | POA: Diagnosis not present

## 2018-09-07 DIAGNOSIS — I5042 Chronic combined systolic (congestive) and diastolic (congestive) heart failure: Secondary | ICD-10-CM | POA: Diagnosis not present

## 2018-09-07 DIAGNOSIS — I340 Nonrheumatic mitral (valve) insufficiency: Secondary | ICD-10-CM | POA: Diagnosis not present

## 2018-09-07 DIAGNOSIS — I255 Ischemic cardiomyopathy: Secondary | ICD-10-CM | POA: Diagnosis not present

## 2018-09-07 DIAGNOSIS — I25118 Atherosclerotic heart disease of native coronary artery with other forms of angina pectoris: Secondary | ICD-10-CM

## 2018-09-07 LAB — BASIC METABOLIC PANEL
BUN / CREAT RATIO: 12 (ref 12–28)
BUN: 12 mg/dL (ref 10–36)
CALCIUM: 9.2 mg/dL (ref 8.7–10.3)
CO2: 21 mmol/L (ref 20–29)
CREATININE: 0.99 mg/dL (ref 0.57–1.00)
Chloride: 104 mmol/L (ref 96–106)
GFR calc Af Amer: 56 mL/min/{1.73_m2} — ABNORMAL LOW (ref >59)
GFR calc non Af Amer: 49 mL/min/{1.73_m2} — ABNORMAL LOW (ref >59)
GLUCOSE: 91 mg/dL (ref 65–99)
Potassium: 4.6 mmol/L (ref 3.5–5.2)
Sodium: 142 mmol/L (ref 134–144)

## 2018-09-07 LAB — HEPATIC FUNCTION PANEL
ALK PHOS: 103 IU/L (ref 39–117)
ALT: 14 IU/L (ref 0–32)
AST: 25 IU/L (ref 0–40)
Albumin: 4 g/dL (ref 3.2–4.6)
BILIRUBIN TOTAL: 0.4 mg/dL (ref 0.0–1.2)
BILIRUBIN, DIRECT: 0.1 mg/dL (ref 0.00–0.40)
TOTAL PROTEIN: 6.8 g/dL (ref 6.0–8.5)

## 2018-09-07 MED ORDER — METOPROLOL TARTRATE 25 MG PO TABS: 12.5000 mg | ORAL_TABLET | Freq: Two times a day (BID) | ORAL | 3 refills | Status: DC

## 2018-09-07 MED ORDER — ATORVASTATIN CALCIUM 80 MG PO TABS: 80.0000 mg | ORAL_TABLET | Freq: Every day | ORAL | 3 refills | Status: DC

## 2018-09-07 NOTE — Patient Instructions (Signed)
Medication Instructions:  Your physician has recommended you make the following change in your medication:   1. STOP: Aspirin  2. STOP: Spironolactone  3. CONTINUE: Brlilinta  If you need a refill on your cardiac medications before your next appointment, please call your pharmacy.   Lab work: TODAY: BMET, LFTS  If you have labs (blood work) drawn today and your tests are completely normal, you will receive your results only by: Marland Kitchen MyChart Message (if you have MyChart) OR . A paper copy in the mail If you have any lab test that is abnormal or we need to change your treatment, we will call you to review the results.  Testing/Procedures: None ordered  Follow-Up: At [facility], you and your health needs are our priority.  As part of our continuing mission to provide you with exceptional heart care, we have created designated Provider Care Teams.  These Care Teams include your primary Cardiologist (physician) and Advanced Practice Providers (APPs -  Physician Assistants and Nurse Practitioners) who all work together to provide you with the care you need, when you need it. . You will need a follow up appointment in MAY 2020.  Please call our office 2 months in advance to schedule this appointment.  You may see Casandra Doffing, MD or one of the following Advanced Practice Providers on your designated Care Team:   . Lyda Jester, PA-C . Dayna Dunn, PA-C . Ermalinda Barrios, PA-C  Any Other Special Instructions Will Be Listed Below (If Applicable).

## 2019-04-19 ENCOUNTER — Ambulatory Visit: Payer: Medicare Other | Admitting: Interventional Cardiology

## 2019-04-20 ENCOUNTER — Telehealth: Payer: Self-pay

## 2019-04-20 NOTE — Telephone Encounter (Signed)

## 2019-04-26 ENCOUNTER — Other Ambulatory Visit: Payer: Self-pay

## 2019-04-26 ENCOUNTER — Encounter: Payer: Self-pay | Admitting: Interventional Cardiology

## 2019-04-26 ENCOUNTER — Telehealth (INDEPENDENT_AMBULATORY_CARE_PROVIDER_SITE_OTHER): Payer: Medicare Other | Admitting: Interventional Cardiology

## 2019-04-26 DIAGNOSIS — Z955 Presence of coronary angioplasty implant and graft: Secondary | ICD-10-CM | POA: Diagnosis not present

## 2019-04-26 DIAGNOSIS — I2510 Atherosclerotic heart disease of native coronary artery without angina pectoris: Secondary | ICD-10-CM | POA: Diagnosis not present

## 2019-04-26 DIAGNOSIS — I5042 Chronic combined systolic (congestive) and diastolic (congestive) heart failure: Secondary | ICD-10-CM

## 2019-04-26 DIAGNOSIS — E785 Hyperlipidemia, unspecified: Secondary | ICD-10-CM | POA: Diagnosis not present

## 2019-04-26 DIAGNOSIS — E782 Mixed hyperlipidemia: Secondary | ICD-10-CM

## 2019-04-26 DIAGNOSIS — I25118 Atherosclerotic heart disease of native coronary artery with other forms of angina pectoris: Secondary | ICD-10-CM

## 2019-04-26 DIAGNOSIS — I252 Old myocardial infarction: Secondary | ICD-10-CM | POA: Diagnosis not present

## 2019-04-26 MED ORDER — CLOPIDOGREL BISULFATE 75 MG PO TABS: 75.0000 mg | ORAL_TABLET | Freq: Every day | ORAL | 3 refills | Status: AC

## 2019-04-26 NOTE — Patient Instructions (Signed)
Medication Instructions:  Your physician has recommended you make the following change in your medication:   Finish your current supply of Brilinta and then start clopidogrel (plavix) 75 mg once a day  Lab work: Please have labwork and EKG done at your Primary Care Doctor and have them fax over results  If you have labs (blood work) drawn today and your tests are completely normal, you will receive your results only by: Marland Kitchen MyChart Message (if you have MyChart) OR . A paper copy in the mail If you have any lab test that is abnormal or we need to change your treatment, we will call you to review the results.  Testing/Procedures: None ordered  Follow-Up: At [facility], you and your health needs are our priority.  As part of our continuing mission to provide you with exceptional heart care, we have created designated Provider Care Teams.  These Care Teams include your primary Cardiologist (physician) and Advanced Practice Providers (APPs -  Physician Assistants and Nurse Practitioners) who all work together to provide you with the care you need, when you need it. . You will need a follow up appointment in 6 months.  Please call our office 2 months in advance to schedule this appointment.  You may see Casandra Doffing, MD or one of the following Advanced Practice Providers on your designated Care Team:   . Lyda Jester, PA-C . Dayna Dunn, PA-C . Ermalinda Barrios, PA-C  Any Other Special Instructions Will Be Listed Below (If Applicable).

## 2019-04-26 NOTE — Progress Notes (Signed)
Virtual Visit via Video Note   This visit type was conducted due to national recommendations for restrictions regarding the COVID-19 Pandemic (e.g. social distancing) in an effort to limit this patient's exposure and mitigate transmission in our community.  Due to her co-morbid illnesses, this patient is at least at moderate risk for complications without adequate follow up.  This format is felt to be most appropriate for this patient at this time.  All issues noted in this document were discussed and addressed.  A limited physical exam was performed with this format.  Please refer to the patient's chart for her consent to telehealth for [facility].   Date:  04/26/2019   ID:  April, Yang 1921-09-17, MRN [id number]  Patient Location: Home Provider Location: Home  PCP:  Mateo Flow, MD  Cardiologist:  Larae Grooms, MD  Electrophysiologist:  None   Evaluation Performed:  Follow-Up Visit  Chief Complaint:  CAD  History of Present Illness:    April Yang is a 85 y.o. female Who was admitted 04/25/2018 with anterior STEMI treated with DES to the LAD x2. Significant 70 to 75% RCA to be treated medically. Ischemic cardiomyopathy LVEF 35 to 40% with grade 1 DD on 2D echo 04/26/2018.Patient had heart failure in the hospital improved with Lasix. Spironolactone was added. Blood pressure was too soft to add ACE or ARB.  She had trouble swallowing the Lipitor and has a metallic taste in her mouth and reduced appetite. She did have physical therapy working with her and this helped strength and balance.   8/19 echo showed:  - Normal LV size with EF 45%. Mid to apical anteroseptal akinesis, apical anterior akinesis. Normal RV size and systolic function. There was moderate mitral regurgitation.  Denies : Chest pain. Dizziness. Leg edema. Nitroglycerin use. Orthopnea. Palpitations. Paroxysmal nocturnal dyspnea. Shortness of breath. Syncope.   Gardening is her most  strenuous activity.  She has access to a stationary bike.  The patient does not have symptoms concerning for COVID-19 infection (fever, chills, cough, or new shortness of breath).    Past Medical History:  Diagnosis Date  . Acute anterior wall MI ([place]) 04/25/2018  . CAD in native artery    a. cath 100% pLAD s/p DES x2. medical managment of 70% RCA  . HLD (hyperlipidemia)   . Hypothyroidism   . Ischemic cardiomyopathy    Past Surgical History:  Procedure Laterality Date  . CORONARY STENT INTERVENTION N/A 04/25/2018   Procedure: CORONARY STENT INTERVENTION;  Surgeon: Jettie Booze, MD;  Location: [place] [facility];  Service: Cardiovascular;  Laterality: N/A;  . LEFT HEART CATH AND CORONARY ANGIOGRAPHY N/A 04/25/2018   Procedure: LEFT HEART CATH AND CORONARY ANGIOGRAPHY;  Surgeon: Jettie Booze, MD;  Location: [place] [facility];  Service: Cardiovascular;  Laterality: N/A;     Current Meds  Medication Sig  . atorvastatin (LIPITOR) 80 MG tablet Take 1 tablet (80 mg total) by mouth daily at 6 PM.  . levothyroxine (SYNTHROID, LEVOTHROID) 75 MCG tablet Take 75 mcg by mouth daily before breakfast.  . metoprolol tartrate (LOPRESSOR) 25 MG tablet Take 0.5 tablets (12.5 mg total) by mouth 2 (two) times daily.  . nitroGLYCERIN (NITROSTAT) 0.4 MG SL tablet Place 1 tablet (0.4 mg total) under the tongue every 5 (five) minutes as needed.  . ticagrelor (BRILINTA) 90 MG TABS tablet Take 1 tablet (90 mg total) by mouth 2 (two) times daily.  . traMADol (ULTRAM) 50 MG tablet  Take 1 tablet by mouth as needed.     Allergies:   Patient has no known allergies.   Social History   Tobacco Use  . Smoking status: Never Smoker  . Smokeless tobacco: Never Used  Substance Use Topics  . Alcohol use: Not on file  . Drug use: Not on file     Family Hx: The patient's family history includes Aneurysm in her mother; Breast cancer in her sister.  ROS:   Please see the history of present  illness.    Easy bruising All other systems reviewed and are negative.   Prior CV studies:   The following studies were reviewed today:  Cath study reviewed  Labs/Other Tests and Data Reviewed:    EKG: 5/19 ECG An ECG dated NSR was personally reviewed today and demonstrated:  NSR, poor R wve progression.  Recent Labs: 09/07/2018: ALT 14; BUN 12; Creatinine, Ser 0.99; Potassium 4.6; Sodium 142   Recent Lipid Panel Lab Results  Component Value Date/Time   CHOL 95 (L) 06/20/2018 09:39 AM   TRIG 125 06/20/2018 09:39 AM   HDL 38 (L) 06/20/2018 09:39 AM   CHOLHDL 2.5 06/20/2018 09:39 AM   CHOLHDL 5.3 04/27/2018 03:30 AM   LDLCALC 32 06/20/2018 09:39 AM    Wt Readings from Last 3 Encounters:  04/26/19 141 lb (64 kg)  09/07/18 148 lb 6.4 oz (67.3 kg)  05/09/18 162 lb (73.5 kg)     Objective:    Vital Signs:  BP 135/64   Pulse 60   Ht 5\' 5"  (1.651 m)   Wt 141 lb (64 kg)   BMI 23.46 kg/m    VITAL SIGNS:  reviewed GEN:  no acute distress RESPIRATORY:  normal respiratory effort, symmetric expansion PSYCH:  normal affect exam limited by video format  ASSESSMENT & PLAN:    1. CAD/Old MI: 1 year from her MI.  Stop Brilinta.  Start plavix 75 mg daily.  No angina despite moderate to severe RCA disease. Continue medical therapy.  2. Hyperlipidemia: LDL 32 in 7/19. Continue statin.  3. Chronic systolic heart failure: Appears euvolemic.  COVID-19 Education: The signs and symptoms of COVID-19 were discussed with the patient and how to seek care for testing (follow up with PCP or arrange E-visit).  The importance of social distancing was discussed today.  Time:   Today, I have spent 20 minutes with the patient with telehealth technology discussing the above problems.     Medication Adjustments/Labs and Tests Ordered: Current medicines are reviewed at length with the patient today.  Concerns regarding medicines are outlined above.   Tests Ordered: No orders of the defined  types were placed in this encounter.   Medication Changes: No orders of the defined types were placed in this encounter.   Disposition:  Follow up in 6 month(s)  Signed, Larae Grooms, MD  04/26/2019 10:56 AM    [place]

## 2019-05-08 DIAGNOSIS — E782 Mixed hyperlipidemia: Secondary | ICD-10-CM | POA: Diagnosis not present

## 2019-05-08 DIAGNOSIS — Z0000 Encounter for general adult medical examination without abnormal findings: Secondary | ICD-10-CM | POA: Diagnosis not present

## 2019-05-08 DIAGNOSIS — E039 Hypothyroidism, unspecified: Secondary | ICD-10-CM | POA: Diagnosis not present

## 2019-05-08 DIAGNOSIS — Z79899 Other long term (current) drug therapy: Secondary | ICD-10-CM | POA: Diagnosis not present

## 2019-05-08 DIAGNOSIS — I2510 Atherosclerotic heart disease of native coronary artery without angina pectoris: Secondary | ICD-10-CM | POA: Diagnosis not present

## 2019-05-14 ENCOUNTER — Inpatient Hospital Stay (HOSPITAL_COMMUNITY)
Admission: EM | Admit: 2019-05-14 | Discharge: 2019-05-16 | DRG: 292 | Disposition: A | Discharge status: Home Health | Payer: Medicare Other | Attending: Internal Medicine | Admitting: Internal Medicine

## 2019-05-14 ENCOUNTER — Emergency Department (HOSPITAL_COMMUNITY): Payer: Medicare Other

## 2019-05-14 ENCOUNTER — Encounter (HOSPITAL_COMMUNITY): Payer: Self-pay | Admitting: Emergency Medicine

## 2019-05-14 ENCOUNTER — Observation Stay (HOSPITAL_COMMUNITY): Payer: Medicare Other

## 2019-05-14 ENCOUNTER — Other Ambulatory Visit: Payer: Self-pay

## 2019-05-14 DIAGNOSIS — I25119 Atherosclerotic heart disease of native coronary artery with unspecified angina pectoris: Secondary | ICD-10-CM | POA: Diagnosis present

## 2019-05-14 DIAGNOSIS — R0902 Hypoxemia: Secondary | ICD-10-CM | POA: Diagnosis not present

## 2019-05-14 DIAGNOSIS — I255 Ischemic cardiomyopathy: Secondary | ICD-10-CM

## 2019-05-14 DIAGNOSIS — R918 Other nonspecific abnormal finding of lung field: Secondary | ICD-10-CM

## 2019-05-14 DIAGNOSIS — R531 Weakness: Secondary | ICD-10-CM | POA: Diagnosis not present

## 2019-05-14 DIAGNOSIS — E039 Hypothyroidism, unspecified: Secondary | ICD-10-CM | POA: Diagnosis present

## 2019-05-14 DIAGNOSIS — R110 Nausea: Secondary | ICD-10-CM

## 2019-05-14 DIAGNOSIS — Z66 Do not resuscitate: Secondary | ICD-10-CM | POA: Diagnosis present

## 2019-05-14 DIAGNOSIS — I10 Essential (primary) hypertension: Secondary | ICD-10-CM | POA: Diagnosis not present

## 2019-05-14 DIAGNOSIS — Z7902 Long term (current) use of antithrombotics/antiplatelets: Secondary | ICD-10-CM | POA: Diagnosis not present

## 2019-05-14 DIAGNOSIS — Z23 Encounter for immunization: Secondary | ICD-10-CM

## 2019-05-14 DIAGNOSIS — I252 Old myocardial infarction: Secondary | ICD-10-CM

## 2019-05-14 DIAGNOSIS — N183 Chronic kidney disease, stage 3 (moderate): Secondary | ICD-10-CM | POA: Diagnosis not present

## 2019-05-14 DIAGNOSIS — Z803 Family history of malignant neoplasm of breast: Secondary | ICD-10-CM

## 2019-05-14 DIAGNOSIS — E785 Hyperlipidemia, unspecified: Secondary | ICD-10-CM | POA: Diagnosis not present

## 2019-05-14 DIAGNOSIS — Z79891 Long term (current) use of opiate analgesic: Secondary | ICD-10-CM | POA: Diagnosis not present

## 2019-05-14 DIAGNOSIS — R931 Abnormal findings on diagnostic imaging of heart and coronary circulation: Secondary | ICD-10-CM | POA: Diagnosis not present

## 2019-05-14 DIAGNOSIS — I700 Atherosclerosis of aorta: Secondary | ICD-10-CM | POA: Diagnosis not present

## 2019-05-14 DIAGNOSIS — Z955 Presence of coronary angioplasty implant and graft: Secondary | ICD-10-CM

## 2019-05-14 DIAGNOSIS — I5042 Chronic combined systolic (congestive) and diastolic (congestive) heart failure: Secondary | ICD-10-CM | POA: Diagnosis not present

## 2019-05-14 DIAGNOSIS — R5383 Other fatigue: Secondary | ICD-10-CM | POA: Diagnosis not present

## 2019-05-14 DIAGNOSIS — N179 Acute kidney failure, unspecified: Secondary | ICD-10-CM | POA: Diagnosis present

## 2019-05-14 DIAGNOSIS — R001 Bradycardia, unspecified: Secondary | ICD-10-CM | POA: Diagnosis not present

## 2019-05-14 DIAGNOSIS — Z20828 Contact with and (suspected) exposure to other viral communicable diseases: Secondary | ICD-10-CM | POA: Diagnosis present

## 2019-05-14 DIAGNOSIS — E782 Mixed hyperlipidemia: Secondary | ICD-10-CM

## 2019-05-14 DIAGNOSIS — Z79899 Other long term (current) drug therapy: Secondary | ICD-10-CM

## 2019-05-14 DIAGNOSIS — R0609 Other forms of dyspnea: Secondary | ICD-10-CM | POA: Diagnosis not present

## 2019-05-14 DIAGNOSIS — J189 Pneumonia, unspecified organism: Secondary | ICD-10-CM

## 2019-05-14 DIAGNOSIS — I2510 Atherosclerotic heart disease of native coronary artery without angina pectoris: Secondary | ICD-10-CM | POA: Diagnosis not present

## 2019-05-14 DIAGNOSIS — I5043 Acute on chronic combined systolic (congestive) and diastolic (congestive) heart failure: Principal | ICD-10-CM | POA: Diagnosis present

## 2019-05-14 DIAGNOSIS — R0989 Other specified symptoms and signs involving the circulatory and respiratory systems: Secondary | ICD-10-CM | POA: Diagnosis not present

## 2019-05-14 DIAGNOSIS — Z79890 Hormone replacement therapy (postmenopausal): Secondary | ICD-10-CM

## 2019-05-14 DIAGNOSIS — E860 Dehydration: Secondary | ICD-10-CM | POA: Diagnosis present

## 2019-05-14 DIAGNOSIS — I517 Cardiomegaly: Secondary | ICD-10-CM | POA: Diagnosis not present

## 2019-05-14 LAB — TSH: TSH: 1.215 u[IU]/mL (ref 0.350–4.500)

## 2019-05-14 LAB — BASIC METABOLIC PANEL
Anion gap: 10 (ref 5–15)
BUN: 16 mg/dL (ref 8–23)
CO2: 21 mmol/L — ABNORMAL LOW (ref 22–32)
Calcium: 8.8 mg/dL — ABNORMAL LOW (ref 8.9–10.3)
Chloride: 106 mmol/L (ref 98–111)
Creatinine, Ser: 1.22 mg/dL — ABNORMAL HIGH (ref 0.44–1.00)
GFR calc Af Amer: 44 mL/min — ABNORMAL LOW (ref >60)
GFR calc non Af Amer: 38 mL/min — ABNORMAL LOW (ref >60)
Glucose, Bld: 142 mg/dL — ABNORMAL HIGH (ref 70–99)
Potassium: 3.6 mmol/L (ref 3.5–5.1)
Sodium: 137 mmol/L (ref 135–145)

## 2019-05-14 LAB — CBC WITH DIFFERENTIAL/PLATELET
Abs Immature Granulocytes: 0.03 10*3/uL (ref 0.00–0.07)
Basophils Absolute: 0 10*3/uL (ref 0.0–0.1)
Basophils Relative: 1 %
Eosinophils Absolute: 0.2 10*3/uL (ref 0.0–0.5)
Eosinophils Relative: 2 %
HCT: 36.4 % (ref 36.0–46.0)
Hemoglobin: 11.6 g/dL — ABNORMAL LOW (ref 12.0–15.0)
Immature Granulocytes: 0 %
Lymphocytes Relative: 10 %
Lymphs Abs: 0.7 10*3/uL (ref 0.7–4.0)
MCH: 30.6 pg (ref 26.0–34.0)
MCHC: 31.9 g/dL (ref 30.0–36.0)
MCV: 96 fL (ref 80.0–100.0)
Monocytes Absolute: 0.5 10*3/uL (ref 0.1–1.0)
Monocytes Relative: 7 %
Neutro Abs: 6 10*3/uL (ref 1.7–7.7)
Neutrophils Relative %: 80 %
Platelets: 239 10*3/uL (ref 150–400)
RBC: 3.79 MIL/uL — ABNORMAL LOW (ref 3.87–5.11)
RDW: 13.7 % (ref 11.5–15.5)
WBC: 7.5 10*3/uL (ref 4.0–10.5)
nRBC: 0 % (ref 0.0–0.2)

## 2019-05-14 LAB — TROPONIN I
Troponin I: <0.03 ng/mL (ref <0.03)
Troponin I: <0.03 ng/mL (ref <0.03)
Troponin I: <0.03 ng/mL (ref <0.03)
Troponin I: <0.03 ng/mL (ref <0.03)

## 2019-05-14 LAB — BRAIN NATRIURETIC PEPTIDE: B Natriuretic Peptide: 414.3 pg/mL — ABNORMAL HIGH (ref 0.0–100.0)

## 2019-05-14 LAB — LACTIC ACID, PLASMA
Lactic Acid, Venous: 1.9 mmol/L (ref 0.5–1.9)
Lactic Acid, Venous: 2 mmol/L (ref 0.5–1.9)

## 2019-05-14 LAB — PROCALCITONIN: Procalcitonin: <0.1 ng/mL

## 2019-05-14 LAB — SARS CORONAVIRUS 2: SARS Coronavirus 2: NOT DETECTED

## 2019-05-14 MED ORDER — LEVOTHYROXINE SODIUM 75 MCG PO TABS
75.0000 ug | ORAL_TABLET | Freq: Every day | ORAL | Status: DC
  Administered 2019-05-14 – 2019-05-16 (×3): 75 ug via ORAL
  Filled 2019-05-14 (×3): qty 1

## 2019-05-14 MED ORDER — LIDOCAINE VISCOUS HCL 2 % MT SOLN
15.0000 mL | Freq: Once | OROMUCOSAL | Status: AC
  Administered 2019-05-14: 15 mL via ORAL
  Filled 2019-05-14: qty 15

## 2019-05-14 MED ORDER — ALUM & MAG HYDROXIDE-SIMETH 200-200-20 MG/5ML PO SUSP
30.0000 mL | Freq: Once | ORAL | Status: AC
  Administered 2019-05-14: 30 mL via ORAL
  Filled 2019-05-14: qty 30

## 2019-05-14 MED ORDER — ACETAMINOPHEN 325 MG PO TABS: 650.0000 mg | ORAL_TABLET | Freq: Four times a day (QID) | ORAL | Status: DC | PRN

## 2019-05-14 MED ORDER — ATORVASTATIN CALCIUM 80 MG PO TABS
80.0000 mg | ORAL_TABLET | Freq: Every day | ORAL | Status: DC
  Administered 2019-05-14: 80 mg via ORAL
  Filled 2019-05-14: qty 1

## 2019-05-14 MED ORDER — PNEUMOCOCCAL VAC POLYVALENT 25 MCG/0.5ML IJ INJ
0.5000 mL | INJECTION | INTRAMUSCULAR | Status: AC
  Administered 2019-05-15: 0.5 mL via INTRAMUSCULAR
  Filled 2019-05-14: qty 0.5

## 2019-05-14 MED ORDER — FUROSEMIDE 20 MG PO TABS
20.0000 mg | ORAL_TABLET | Freq: Once | ORAL | Status: AC
  Administered 2019-05-14: 20 mg via ORAL
  Filled 2019-05-14: qty 1

## 2019-05-14 MED ORDER — ATORVASTATIN CALCIUM 40 MG PO TABS
40.0000 mg | ORAL_TABLET | Freq: Every day | ORAL | Status: DC
  Administered 2019-05-15: 40 mg via ORAL
  Filled 2019-05-14: qty 1

## 2019-05-14 MED ORDER — TRAMADOL HCL 50 MG PO TABS: 50.0000 mg | ORAL_TABLET | ORAL | Status: DC | PRN

## 2019-05-14 MED ORDER — ACETAMINOPHEN 650 MG RE SUPP: 650.0000 mg | Freq: Four times a day (QID) | RECTAL | Status: DC | PRN

## 2019-05-14 MED ORDER — SODIUM CHLORIDE 0.9 % IV SOLN
1.0000 g | Freq: Once | INTRAVENOUS | Status: AC
  Administered 2019-05-14: 1 g via INTRAVENOUS
  Filled 2019-05-14: qty 10

## 2019-05-14 MED ORDER — ENOXAPARIN SODIUM 30 MG/0.3ML ~~LOC~~ SOLN
30.0000 mg | SUBCUTANEOUS | Status: DC
  Administered 2019-05-14 – 2019-05-15 (×2): 30 mg via SUBCUTANEOUS
  Filled 2019-05-14 (×2): qty 0.3

## 2019-05-14 MED ORDER — ONDANSETRON HCL 4 MG/2ML IJ SOLN
4.0000 mg | Freq: Once | INTRAMUSCULAR | Status: AC
  Administered 2019-05-14: 4 mg via INTRAVENOUS
  Filled 2019-05-14: qty 2

## 2019-05-14 MED ORDER — SODIUM CHLORIDE 0.9 % IV SOLN
500.0000 mg | Freq: Once | INTRAVENOUS | Status: AC
  Administered 2019-05-14: 500 mg via INTRAVENOUS
  Filled 2019-05-14: qty 500

## 2019-05-14 MED ORDER — TRAMADOL HCL 50 MG PO TABS
50.0000 mg | ORAL_TABLET | Freq: Two times a day (BID) | ORAL | Status: DC | PRN
  Administered 2019-05-15: 50 mg via ORAL
  Filled 2019-05-14: qty 1

## 2019-05-14 MED ORDER — ONDANSETRON HCL 4 MG PO TABS: 4.0000 mg | ORAL_TABLET | Freq: Four times a day (QID) | ORAL | Status: DC | PRN

## 2019-05-14 MED ORDER — CLOPIDOGREL BISULFATE 75 MG PO TABS
75.0000 mg | ORAL_TABLET | Freq: Every day | ORAL | Status: DC
  Administered 2019-05-14 – 2019-05-16 (×3): 75 mg via ORAL
  Filled 2019-05-14 (×4): qty 1

## 2019-05-14 MED ORDER — ONDANSETRON HCL 4 MG/2ML IJ SOLN: 4.0000 mg | Freq: Four times a day (QID) | INTRAMUSCULAR | Status: DC | PRN

## 2019-05-14 NOTE — ED Triage Notes (Signed)
Pt to ED via [facility] with c/o generalized weakness and burping.  Pt st's she started burping earlier tonight then became weak.,  Pt also st's she had a MI 1 year ago and had the same symptoms.  Pt st's she took NTG 1 SL  And EMS gave pt ASA 324mg    Pt denies any chest pain

## 2019-05-14 NOTE — Progress Notes (Signed)
Patient seen and examined personally, I reviewed the chart, history and physical and admission note, done by admitting physician this morning and agree with the same with following addendum.  Please refer to the morning admission note for more detailed plan of care.  Briefly, 85 year old female with history of CAD/ICM with EF 45% history of S2 recent MI in May 2019 with PCI with DES to LAD x2 and 70-75% RCA disease comes to the ER for evaluation of generalized weakness/fatigue/dyspnea on exertion/nausea with burping, patient was concerned about her symptoms as they felt like her previous MI. In the ER troponin negative so far, EKG unchanged, nonischemic.  Patient is afebrile, chest x-ray with bibasilar infiltrates could be just come back negative placed on ceftriaxone through nursing for pneumonia and was admitted. issues Fatigue/dyspnea on exertion: Suspect in the setting of multiple comorbidities/deconditioning, rule out ACS, rule out chest x-ray showed bilateral infiltrates, procalcitonin pending patient is afebrile, no leukocytosis and COVID negative.  Received antibiotics in the ER and on hold for now.  ordered CT chest to better evaluate her lung infiltrates, follow-up on procalcitonin level which came back negative. BNP reviewed and elevated  AT 414.TSH normal. .CT chest reviewed shows no acute infiltrates and no evidence of pneumonia or pulmonary edema.Blood cx sent this am.  Urinalysis, Respiratory virus panel pending. We will monitor serial troponin and repeat bnp in am  Patient has chronic systolic CHF, w last echo showing EF 45%, add diureticsx1, BNP is at 414, repeat in am.  Other issues CAD/ischemic cardiomyopathy/hyperlipidemia: On Plavix, Lipitor.  Nausea on a as needed Zofran.  Sinus bradycardia holding metoprolol for now.  We will hold off on further antibiotics.Obtain PT evaluation.  Anticipate discharge in next 24 hours if remains stable.

## 2019-05-14 NOTE — ED Provider Notes (Signed)
[facility] EMERGENCY DEPARTMENT Provider Note   CSN: [id number] Arrival date & time: 05/14/19  4174     History   Chief Complaint Chief Complaint  Patient presents with  . Fatigue    HPI April Yang is a 85 y.o. female.     The history is provided by the patient.  Illness Location:  Home Quality:  Fatigue and retching episodic  Severity:  Moderate Onset quality:  Gradual Timing:  Intermittent Progression:  Waxing and waning Chronicity:  Recurrent Context:  Patient is elderly with cardiac patient Relieved by:  Nothing Worsened by:  Nothing Ineffective treatments:  None tried Associated symptoms: fatigue   Associated symptoms: no fever, no shortness of breath and no sore throat   Risk factors:  Being elderly with cardiac disease.    Past Medical History:  Diagnosis Date  . Acute anterior wall MI ([place]) 04/25/2018  . CAD in native artery    a. cath 100% pLAD s/p DES x2. medical managment of 70% RCA  . HLD (hyperlipidemia)   . Hypothyroidism   . Ischemic cardiomyopathy     Patient Active Problem List   Diagnosis Date Noted  . Ischemic cardiomyopathy 05/09/2018  . Hyperlipidemia 05/09/2018  . Chronic combined systolic and diastolic CHF (congestive heart failure) ([place]) 05/09/2018  . Acute anterior wall MI ([place]) 04/25/2018    Past Surgical History:  Procedure Laterality Date  . CORONARY STENT INTERVENTION N/A 04/25/2018   Procedure: CORONARY STENT INTERVENTION;  Surgeon: Jettie Booze, MD;  Location: [place] [facility];  Service: Cardiovascular;  Laterality: N/A;  . LEFT HEART CATH AND CORONARY ANGIOGRAPHY N/A 04/25/2018   Procedure: LEFT HEART CATH AND CORONARY ANGIOGRAPHY;  Surgeon: Jettie Booze, MD;  Location: [facility];  Service: Cardiovascular;  Laterality: N/A;     OB History   No obstetric history on file.      Home Medications    Prior to Admission medications   Medication Sig Start Date End Date  Taking? Authorizing Provider  atorvastatin (LIPITOR) 80 MG tablet Take 1 tablet (80 mg total) by mouth daily at 6 PM. 09/07/18   Jettie Booze, MD  clopidogrel (PLAVIX) 75 MG tablet Take 1 tablet (75 mg total) by mouth daily. 04/26/19   Jettie Booze, MD  levothyroxine (SYNTHROID, LEVOTHROID) 75 MCG tablet Take 75 mcg by mouth daily before breakfast. 03/10/18   [provider]  metoprolol tartrate (LOPRESSOR) 25 MG tablet Take 0.5 tablets (12.5 mg total) by mouth 2 (two) times daily. 09/07/18   Jettie Booze, MD  nitroGLYCERIN (NITROSTAT) 0.4 MG SL tablet Place 1 tablet (0.4 mg total) under the tongue every 5 (five) minutes as needed. 04/28/18   Bhagat, Crista Luria, PA  traMADol (ULTRAM) 50 MG tablet Take 1 tablet by mouth as needed. 08/14/18   [provider]    Family History Family History  Problem Relation Age of Onset  . Aneurysm Mother   . Breast cancer Sister     Social History Social History   Tobacco Use  . Smoking status: Never Smoker  . Smokeless tobacco: Never Used  Substance Use Topics  . Alcohol use: Not on file  . Drug use: Not on file     Allergies   Patient has no known allergies.   Review of Systems Review of Systems  Constitutional: Positive for fatigue. Negative for fever.  HENT: Negative for sore throat.   Respiratory: Negative for shortness of breath.   All  other systems reviewed and are negative.    Physical Exam Updated Vital Signs BP (!) 137/57   Pulse (!) 40   Temp 98.6 F (37 C) (Oral)   Resp 17   Ht 5\' 5"  (1.651 m)   Wt 62.6 kg   LMP  (Exact Date)   SpO2 97%   BMI 22.96 kg/m   Physical Exam Vitals signs and nursing note reviewed.  Constitutional:      General: She is not in acute distress.    Appearance: She is normal weight.  HENT:     Head: Normocephalic and atraumatic.     Nose: Nose normal.     Mouth/Throat:     Mouth: Mucous membranes are moist.     Pharynx: Oropharynx is clear.   Eyes:     Extraocular Movements: Extraocular movements intact.     Conjunctiva/sclera: Conjunctivae normal.     Pupils: Pupils are equal, round, and reactive to light.  Neck:     Musculoskeletal: Normal range of motion and neck supple.  Cardiovascular:     Rate and Rhythm: Normal rate and regular rhythm.     Pulses: Normal pulses.     Heart sounds: Normal heart sounds.  Pulmonary:     Effort: Pulmonary effort is normal.     Breath sounds: Decreased air movement present.  Abdominal:     General: Abdomen is flat. Bowel sounds are normal.     Tenderness: There is no abdominal tenderness. There is no guarding or rebound.  Musculoskeletal:     Right lower leg: No edema.     Left lower leg: No edema.  Skin:    General: Skin is warm and dry.     Capillary Refill: Capillary refill takes less than 2 seconds.  Neurological:     General: No focal deficit present.     Mental Status: She is alert and oriented to person, place, and time.  Psychiatric:        Mood and Affect: Mood normal.        Behavior: Behavior normal.      ED Treatments / Results  Labs (all labs ordered are listed, but only abnormal results are displayed) Results for orders placed or performed during the hospital encounter of 05/14/19  CBC with Differential/Platelet  Result Value Ref Range   WBC 7.5 4.0 - 10.5 K/uL   RBC 3.79 (L) 3.87 - 5.11 MIL/uL   Hemoglobin 11.6 (L) 12.0 - 15.0 g/dL   HCT 36.4 36.0 - 46.0 %   MCV 96.0 80.0 - 100.0 fL   MCH 30.6 26.0 - 34.0 pg   MCHC 31.9 30.0 - 36.0 g/dL   RDW 13.7 11.5 - 15.5 %   Platelets 239 150 - 400 K/uL   nRBC 0.0 0.0 - 0.2 %   Neutrophils Relative % 80 %   Neutro Abs 6.0 1.7 - 7.7 K/uL   Lymphocytes Relative 10 %   Lymphs Abs 0.7 0.7 - 4.0 K/uL   Monocytes Relative 7 %   Monocytes Absolute 0.5 0.1 - 1.0 K/uL   Eosinophils Relative 2 %   Eosinophils Absolute 0.2 0.0 - 0.5 K/uL   Basophils Relative 1 %   Basophils Absolute 0.0 0.0 - 0.1 K/uL   Immature  Granulocytes 0 %   Abs Immature Granulocytes 0.03 0.00 - 0.07 K/uL  Basic metabolic panel  Result Value Ref Range   Sodium 137 135 - 145 mmol/L   Potassium 3.6 3.5 - 5.1 mmol/L   Chloride  106 98 - 111 mmol/L   CO2 21 (L) 22 - 32 mmol/L   Glucose, Bld 142 (H) 70 - 99 mg/dL   BUN 16 8 - 23 mg/dL   Creatinine, Ser 1.22 (H) 0.44 - 1.00 mg/dL   Calcium 8.8 (L) 8.9 - 10.3 mg/dL   GFR calc non Af Amer 38 (L) >60 mL/min   GFR calc Af Amer 44 (L) >60 mL/min   Anion gap 10 5 - 15  Troponin I - ONCE - STAT  Result Value Ref Range   Troponin I <0.03 <0.03 ng/mL   Dg Chest Portable 1 View  Result Date: 05/14/2019 CLINICAL DATA:  85 year old with burping. Weakness. EXAM: PORTABLE CHEST 1 VIEW COMPARISON:  None. FINDINGS: Low lung volumes. Nonspecific patchy bibasilar opacities. Normal heart size for technique. Aortic atherosclerosis. Mild biapical pleuroparenchymal scarring. No pulmonary edema, pleural fluid or pneumothorax. No acute osseous abnormalities are seen. IMPRESSION: 1. Low lung volumes with patchy bibasilar opacities, atelectasis versus pneumonia, including atypical viral infection. 2.  Aortic Atherosclerosis (ICD10-I70.0). Electronically Signed   By: Keith Rake M.D.   On: 05/14/2019 03:17    EKG EKG Interpretation  Date/Time:  Monday May 14 2019 03:01:14 EDT Ventricular Rate:  63 PR Interval:    QRS Duration: 97 QT Interval:  425 QTC Calculation: 435 R Axis:   19 Text Interpretation:  Sinus rhythm Atrial premature complexes in couplets Anteroseptal infarct, age indeterminate Confirmed by Dory Horn) on 05/14/2019 4:39:20 AM   Radiology Dg Chest Portable 1 View  Result Date: 05/14/2019 CLINICAL DATA:  85 year old with burping. Weakness. EXAM: PORTABLE CHEST 1 VIEW COMPARISON:  None. FINDINGS: Low lung volumes. Nonspecific patchy bibasilar opacities. Normal heart size for technique. Aortic atherosclerosis. Mild biapical pleuroparenchymal scarring. No pulmonary  edema, pleural fluid or pneumothorax. No acute osseous abnormalities are seen. IMPRESSION: 1. Low lung volumes with patchy bibasilar opacities, atelectasis versus pneumonia, including atypical viral infection. 2.  Aortic Atherosclerosis (ICD10-I70.0). Electronically Signed   By: Keith Rake M.D.   On: 05/14/2019 03:17    Procedures Procedures (including critical care time)  Medications Ordered in ED Medications  cefTRIAXone (ROCEPHIN) 1 g in sodium chloride 0.9 % 100 mL IVPB (has no administration in time range)  azithromycin (ZITHROMAX) 500 mg in sodium chloride 0.9 % 250 mL IVPB (has no administration in time range)  alum & mag hydroxide-simeth (MAALOX/MYLANTA) 200-200-20 MG/5ML suspension 30 mL (30 mLs Oral Given 05/14/19 0324)    And  lidocaine (XYLOCAINE) 2 % viscous mouth solution 15 mL (15 mLs Oral Given 05/14/19 0324)  ondansetron (ZOFRAN) injection 4 mg (4 mg Intravenous Given 05/14/19 0405)     Case d/w Dr. Alcario Drought Final Clinical Impressions(s) / ED Diagnoses   Will need admission to medicine.     Corabelle Spackman, MD 05/14/19 0865

## 2019-05-14 NOTE — Plan of Care (Signed)
  Problem: Education: Goal: Knowledge of General Education information will improve Description Including pain rating scale, medication(s)/side effects and non-pharmacologic comfort measures Outcome: Progressing   

## 2019-05-14 NOTE — ED Notes (Signed)
Patient placed on purewick. Pt was found trying to get out of bed. Pt educated on the use of the call light and risk of fall.

## 2019-05-14 NOTE — H&P (Signed)
History and Physical    BONNITA NEWBY ZJI:[id number] DOB: 09/17/1921 DOA: 05/14/2019  PCP: Mateo Flow, MD  Patient coming from: Home  I have personally briefly reviewed patient's old medical records in [place]  Chief Complaint: Fatigue, belching  HPI: April Yang is a 85 y.o. female with medical history significant of CAD ICM EF 45% following a STEMI in May of last year.  She had PCI with DES to LAD x2, also had significant 70-75% RCA disease which has been treated medically.  Saw Dr. Irish Lack on 5/28, no angina at that time despite mod to severe RCA dz.  Brilinta was stopped and she was started on Plavix instead.  Over the past day or two (especially yesterday) she has had generalized weakness and fatigue, DOE, and nausea / burping.  Symptoms that are alarmingly identical to her STEMI x1 year ago.  No CP though she didn't have any during STEMI last year.  Symptoms are intermittent.  Last occurred on ambulance on way to hospital.  Symptom episodes seemed to be becoming more frequent.  No fever, no sore throat, no cough, no abd pain.   ED Course: Trop is negative x1 thus far (in stark contrast to the trop >65 when she had STEMI in May last year), EKG essentially unchanged from June of last year, Certainly no STEMI unlike the May EKGs.  No SIRS.  HR 55 while awake, down into the 40s / upper 30s while asleep.  CXR shows bibasilar infiltrates.  COVID testing has come back negative.  EDP started rocephin / azithro for CAP.  Review of Systems: As per HPI otherwise 10 point review of systems negative.   Past Medical History:  Diagnosis Date  . Acute anterior wall MI ([place]) 04/25/2018  . CAD in native artery    a. cath 100% pLAD s/p DES x2. medical managment of 70% RCA  . HLD (hyperlipidemia)   . Hypothyroidism   . Ischemic cardiomyopathy     Past Surgical History:  Procedure Laterality Date  . CORONARY STENT INTERVENTION N/A 04/25/2018   Procedure: CORONARY STENT  INTERVENTION;  Surgeon: Jettie Booze, MD;  Location: [facility];  Service: Cardiovascular;  Laterality: N/A;  . LEFT HEART CATH AND CORONARY ANGIOGRAPHY N/A 04/25/2018   Procedure: LEFT HEART CATH AND CORONARY ANGIOGRAPHY;  Surgeon: Jettie Booze, MD;  Location: [facility];  Service: Cardiovascular;  Laterality: N/A;     reports that she has never smoked. She has never used smokeless tobacco. No history on file for alcohol and drug.  No Known Allergies  Family History  Problem Relation Age of Onset  . Aneurysm Mother   . Breast cancer Sister      Prior to Admission medications   Medication Sig Start Date End Date Taking? Authorizing Provider  atorvastatin (LIPITOR) 80 MG tablet Take 1 tablet (80 mg total) by mouth daily at 6 PM. 09/07/18   Jettie Booze, MD  clopidogrel (PLAVIX) 75 MG tablet Take 1 tablet (75 mg total) by mouth daily. 04/26/19   Jettie Booze, MD  levothyroxine (SYNTHROID, LEVOTHROID) 75 MCG tablet Take 75 mcg by mouth daily before breakfast. 03/10/18   [provider]  metoprolol tartrate (LOPRESSOR) 25 MG tablet Take 0.5 tablets (12.5 mg total) by mouth 2 (two) times daily. 09/07/18   Jettie Booze, MD  nitroGLYCERIN (NITROSTAT) 0.4 MG SL tablet Place 1 tablet (0.4 mg total) under the tongue every 5 (five) minutes as  needed. 04/28/18   Bhagat, Crista Luria, PA  traMADol (ULTRAM) 50 MG tablet Take 1 tablet by mouth as needed. 08/14/18   [provider]    Physical Exam: Vitals:   05/14/19 0430 05/14/19 0500 05/14/19 0515 05/14/19 0530  BP: (!) 137/57 (!) 159/62 (!) 163/63 (!) 160/67  Pulse: (!) 40 (!) 55 (!) 56 (!) 55  Resp: 17 (!) 24 18 (!) 26  Temp:      TempSrc:      SpO2: 97% 97% 98% 96%  Weight:      Height:        Constitutional: NAD, calm, comfortable Eyes: PERRL, lids and conjunctivae normal ENMT: Mucous membranes are moist. Posterior pharynx clear of any exudate or lesions.Normal  dentition.  Neck: normal, supple, no masses, no thyromegaly Respiratory: clear to auscultation bilaterally, no wheezing, no crackles. Normal respiratory effort. No accessory muscle use.  Cardiovascular: Regular rate and rhythm, no murmurs / rubs / gallops. No extremity edema. 2+ pedal pulses. No carotid bruits.  Abdomen: no tenderness, no masses palpated. No hepatosplenomegaly. Bowel sounds positive.  Musculoskeletal: no clubbing / cyanosis. No joint deformity upper and lower extremities. Good ROM, no contractures. Normal muscle tone.  Skin: no rashes, lesions, ulcers. No induration Neurologic: CN 2-12 grossly intact. Sensation intact, DTR normal. Strength 5/5 in all 4.  Psychiatric: Normal judgment and insight. Alert and oriented x 3. Normal mood.    Labs on Admission: I have personally reviewed following labs and imaging studies  CBC: Recent Labs  Lab 05/14/19 0306  WBC 7.5  NEUTROABS 6.0  HGB 11.6*  HCT 36.4  MCV 96.0  PLT 062   Basic Metabolic Panel: Recent Labs  Lab 05/14/19 0306  NA 137  K 3.6  CL 106  CO2 21*  GLUCOSE 142*  BUN 16  CREATININE 1.22*  CALCIUM 8.8*   GFR: Estimated Creatinine Clearance: 24.8 mL/min (A) (by C-G formula based on SCr of 1.22 mg/dL (H)). Liver Function Tests: No results for input(s): AST, ALT, ALKPHOS, BILITOT, PROT, ALBUMIN in the last 168 hours. No results for input(s): LIPASE, AMYLASE in the last 168 hours. No results for input(s): AMMONIA in the last 168 hours. Coagulation Profile: No results for input(s): INR, PROTIME in the last 168 hours. Cardiac Enzymes: Recent Labs  Lab 05/14/19 0306  TROPONINI <0.03   BNP (last 3 results) No results for input(s): PROBNP in the last 8760 hours. HbA1C: No results for input(s): HGBA1C in the last 72 hours. CBG: No results for input(s): GLUCAP in the last 168 hours. Lipid Profile: No results for input(s): CHOL, HDL, LDLCALC, TRIG, CHOLHDL, LDLDIRECT in the last 72 hours. Thyroid  Function Tests: No results for input(s): TSH, T4TOTAL, FREET4, T3FREE, THYROIDAB in the last 72 hours. Anemia Panel: No results for input(s): VITAMINB12, FOLATE, FERRITIN, TIBC, IRON, RETICCTPCT in the last 72 hours. Urine analysis: No results found for: COLORURINE, APPEARANCEUR, LABSPEC, PHURINE, GLUCOSEU, HGBUR, BILIRUBINUR, KETONESUR, PROTEINUR, UROBILINOGEN, NITRITE, LEUKOCYTESUR  Radiological Exams on Admission: Dg Chest Portable 1 View  Result Date: 05/14/2019 CLINICAL DATA:  85 year old with burping. Weakness. EXAM: PORTABLE CHEST 1 VIEW COMPARISON:  None. FINDINGS: Low lung volumes. Nonspecific patchy bibasilar opacities. Normal heart size for technique. Aortic atherosclerosis. Mild biapical pleuroparenchymal scarring. No pulmonary edema, pleural fluid or pneumothorax. No acute osseous abnormalities are seen. IMPRESSION: 1. Low lung volumes with patchy bibasilar opacities, atelectasis versus pneumonia, including atypical viral infection. 2.  Aortic Atherosclerosis (ICD10-I70.0). Electronically Signed   By: Keith Rake M.D.   On:  05/14/2019 03:17    EKG: Independently reviewed.  Assessment/Plan Principal Problem:   Bilateral pulmonary infiltrates on chest x-ray Active Problems:   Ischemic cardiomyopathy   Hyperlipidemia   Chronic combined systolic and diastolic CHF (congestive heart failure) (HCC)   Bradycardia   Nausea    1. B pulm infiltrates on CXR, nausea, generalized weakness - 1. DDx includes: 1. CAP 2. CHF 3. Cardiac Angina from the RCA lesion 4. MI 2. Tele monitor 3. Serial trops 4. BNP, procalcitonin, RVP, lactic acid, TSH 5. BCx 6. Urinalysis 7. Watch for development of fever or other SIRS 8. Will let patient eat for now 9. Got rocephin / azithro in ED, so covered ABx wise for the next 24 hours while we await above testing to come back 2. CAD / ICM - Continue plavix 3. HLD - Continue Statin 4. Nausea - Zofran 5. Bradycardia - will hold metoprolol since  she gets rather brady when sleeping.  DVT prophylaxis: Lovenox Code Status: DNR - confirmed with patient and with daughters over phone Family Communication: Spoke with Daughters over phone Disposition Plan: Home after admit Consults called: None Admission status: Place in [place], [place] [facility]  How to contact the [facility] Attending or Consulting provider [place] or covering provider during after hours [facility], for this patient?  1. Check the care team in [facility] and look for a) attending/consulting [facility] provider listed and b) the [facility] team listed 2. Log into [email or web address]  Amion Physician Scheduling and messaging for groups and whole hospitals  On call and physician scheduling software for group practices, residents, hospitalists and other medical providers for call, clinic, rotation and shift schedules. On[employer] is a hospital-wide system for scheduling doctors and paging doctors on call. EasyPlot is for scientific plotting and data analysis.  [email or web address]  and use [place]'s universal password to access. If you do not have the password, please contact the hospital operator.  3. Locate the [facility] provider you are looking for under [facility] and page to a number that you can be directly reached. 4. If you still have difficulty reaching the provider, please page the [facility] (Director on Call) for the Hospitalists listed on amion for assistance.  05/14/2019, 6:14 AM

## 2019-05-15 ENCOUNTER — Observation Stay (HOSPITAL_COMMUNITY): Payer: Medicare Other

## 2019-05-15 DIAGNOSIS — I5042 Chronic combined systolic (congestive) and diastolic (congestive) heart failure: Secondary | ICD-10-CM | POA: Diagnosis not present

## 2019-05-15 DIAGNOSIS — R931 Abnormal findings on diagnostic imaging of heart and coronary circulation: Secondary | ICD-10-CM | POA: Diagnosis not present

## 2019-05-15 DIAGNOSIS — R918 Other nonspecific abnormal finding of lung field: Secondary | ICD-10-CM | POA: Diagnosis not present

## 2019-05-15 DIAGNOSIS — I255 Ischemic cardiomyopathy: Secondary | ICD-10-CM | POA: Diagnosis not present

## 2019-05-15 DIAGNOSIS — I252 Old myocardial infarction: Secondary | ICD-10-CM | POA: Diagnosis not present

## 2019-05-15 DIAGNOSIS — Z79890 Hormone replacement therapy (postmenopausal): Secondary | ICD-10-CM | POA: Diagnosis not present

## 2019-05-15 DIAGNOSIS — Z7902 Long term (current) use of antithrombotics/antiplatelets: Secondary | ICD-10-CM | POA: Diagnosis not present

## 2019-05-15 DIAGNOSIS — R0609 Other forms of dyspnea: Secondary | ICD-10-CM | POA: Diagnosis not present

## 2019-05-15 DIAGNOSIS — Z23 Encounter for immunization: Secondary | ICD-10-CM | POA: Diagnosis not present

## 2019-05-15 DIAGNOSIS — Z20828 Contact with and (suspected) exposure to other viral communicable diseases: Secondary | ICD-10-CM | POA: Diagnosis present

## 2019-05-15 DIAGNOSIS — R5383 Other fatigue: Secondary | ICD-10-CM | POA: Diagnosis present

## 2019-05-15 DIAGNOSIS — Z66 Do not resuscitate: Secondary | ICD-10-CM | POA: Diagnosis present

## 2019-05-15 DIAGNOSIS — Z79891 Long term (current) use of opiate analgesic: Secondary | ICD-10-CM | POA: Diagnosis not present

## 2019-05-15 DIAGNOSIS — I2510 Atherosclerotic heart disease of native coronary artery without angina pectoris: Secondary | ICD-10-CM | POA: Diagnosis not present

## 2019-05-15 DIAGNOSIS — E039 Hypothyroidism, unspecified: Secondary | ICD-10-CM | POA: Diagnosis present

## 2019-05-15 DIAGNOSIS — N183 Chronic kidney disease, stage 3 (moderate): Secondary | ICD-10-CM | POA: Diagnosis present

## 2019-05-15 DIAGNOSIS — Z955 Presence of coronary angioplasty implant and graft: Secondary | ICD-10-CM | POA: Diagnosis not present

## 2019-05-15 DIAGNOSIS — E785 Hyperlipidemia, unspecified: Secondary | ICD-10-CM | POA: Diagnosis present

## 2019-05-15 DIAGNOSIS — N179 Acute kidney failure, unspecified: Secondary | ICD-10-CM | POA: Diagnosis present

## 2019-05-15 DIAGNOSIS — I5043 Acute on chronic combined systolic (congestive) and diastolic (congestive) heart failure: Secondary | ICD-10-CM | POA: Diagnosis present

## 2019-05-15 DIAGNOSIS — Z803 Family history of malignant neoplasm of breast: Secondary | ICD-10-CM | POA: Diagnosis not present

## 2019-05-15 DIAGNOSIS — E860 Dehydration: Secondary | ICD-10-CM | POA: Diagnosis present

## 2019-05-15 DIAGNOSIS — I25119 Atherosclerotic heart disease of native coronary artery with unspecified angina pectoris: Secondary | ICD-10-CM | POA: Diagnosis present

## 2019-05-15 DIAGNOSIS — Z79899 Other long term (current) drug therapy: Secondary | ICD-10-CM | POA: Diagnosis not present

## 2019-05-15 LAB — COMPREHENSIVE METABOLIC PANEL
ALT: 25 U/L (ref 0–44)
AST: 44 U/L — ABNORMAL HIGH (ref 15–41)
Albumin: 2.7 g/dL — ABNORMAL LOW (ref 3.5–5.0)
Alkaline Phosphatase: 157 U/L — ABNORMAL HIGH (ref 38–126)
Anion gap: 8 (ref 5–15)
BUN: 16 mg/dL (ref 8–23)
CO2: 24 mmol/L (ref 22–32)
Calcium: 8.8 mg/dL — ABNORMAL LOW (ref 8.9–10.3)
Chloride: 106 mmol/L (ref 98–111)
Creatinine, Ser: 1.59 mg/dL — ABNORMAL HIGH (ref 0.44–1.00)
GFR calc Af Amer: 32 mL/min — ABNORMAL LOW (ref >60)
GFR calc non Af Amer: 27 mL/min — ABNORMAL LOW (ref >60)
Glucose, Bld: 104 mg/dL — ABNORMAL HIGH (ref 70–99)
Potassium: 3.8 mmol/L (ref 3.5–5.1)
Sodium: 138 mmol/L (ref 135–145)
Total Bilirubin: 0.7 mg/dL (ref 0.3–1.2)
Total Protein: 7.2 g/dL (ref 6.5–8.1)

## 2019-05-15 LAB — BASIC METABOLIC PANEL
Anion gap: 9 (ref 5–15)
BUN: 20 mg/dL (ref 8–23)
CO2: 24 mmol/L (ref 22–32)
Calcium: 8.8 mg/dL — ABNORMAL LOW (ref 8.9–10.3)
Chloride: 104 mmol/L (ref 98–111)
Creatinine, Ser: 1.72 mg/dL — ABNORMAL HIGH (ref 0.44–1.00)
GFR calc Af Amer: 29 mL/min — ABNORMAL LOW (ref >60)
GFR calc non Af Amer: 25 mL/min — ABNORMAL LOW (ref >60)
Glucose, Bld: 105 mg/dL — ABNORMAL HIGH (ref 70–99)
Potassium: 3.9 mmol/L (ref 3.5–5.1)
Sodium: 137 mmol/L (ref 135–145)

## 2019-05-15 LAB — BRAIN NATRIURETIC PEPTIDE: B Natriuretic Peptide: 685.1 pg/mL — ABNORMAL HIGH (ref 0.0–100.0)

## 2019-05-15 LAB — ECHOCARDIOGRAM COMPLETE
Height: 65 in
Weight: 2225.6 oz

## 2019-05-15 MED ORDER — SODIUM CHLORIDE 0.9 % IV SOLN
INTRAVENOUS | Status: AC
  Administered 2019-05-15: 08:00:00 via INTRAVENOUS

## 2019-05-15 MED ORDER — SODIUM CHLORIDE 0.9 % IV SOLN: INTRAVENOUS | Status: AC

## 2019-05-15 NOTE — Evaluation (Addendum)
Occupational Therapy Evaluation and Discharge Patient Details Name: April Yang MRN: [id number] DOB: Jun 25, 1921 Today's Date: 05/15/2019    History of Present Illness 85 yo admitted with weakness, dyspnea, nausea. CT cheset negative. PMhx: CAD, ICM, MI, CHF, HLD   Clinical Impression   PTA patient independent with ADLs, mobility (using cane in community) and IADLs; she reports she stay alone during the day, and stays nights with her daughter--but has availability of 24/7 support.  Patient admitted for above, presenting near baseline functional mobility, transfer and ADL status.  Completing transfers with supervision, LB ADLs with supervision, toileting and grooming with supervision.  Reviewed safety and recommendations, 24/7 support initially.  Based on performance today, no further OT needs have been identified and OT will sign off. Thank you for this referral.     Follow Up Recommendations  No OT follow up;Supervision/Assistance - 24 hour    Equipment Recommendations  None recommended by OT    Recommendations for Other Services PT consult     Precautions / Restrictions Precautions Precautions: Fall Restrictions Weight Bearing Restrictions: No      Mobility Bed Mobility Overal bed mobility: Modified Independent             General bed mobility comments: no assist required, HOB slightly elevated  Transfers Overall transfer level: Needs assistance Equipment used: None Transfers: Sit to/from Stand Sit to Stand: Supervision         General transfer comment: supervision for safety     Balance Overall balance assessment: Mild deficits observed, not formally tested(pt preference to UE support during mobility )                                         ADL either performed or assessed with clinical judgement   ADL Overall ADL's : Needs assistance/impaired     Grooming: Supervision/safety;Standing;Wash/dry hands;Oral care;Brushing hair   Upper  Body Bathing: Set up;Supervision/ safety;Sitting   Lower Body Bathing: Set up;Supervison/ safety;Sit to/from stand   Upper Body Dressing : Set up;Sitting   Lower Body Dressing: Set up;Sit to/from stand Lower Body Dressing Details (indicate cue type and reason): donning socks with setup, supervision sit<>stand  Toilet Transfer: Supervision/safety   Toileting- Clothing Manipulation and Hygiene: Supervision/safety       Functional mobility during ADLs: Supervision/safety General ADL Comments: pt furniture walking throughout session, close supervision for safety      Vision Baseline Vision/History: Macular Degeneration Patient Visual Report: No change from baseline       Perception     Praxis      Pertinent Vitals/Pain Pain Assessment: No/denies pain     Hand Dominance     Extremity/Trunk Assessment Upper Extremity Assessment Upper Extremity Assessment: Generalized weakness   Lower Extremity Assessment Lower Extremity Assessment: Defer to PT evaluation       Communication Communication Communication: HOH   Cognition Arousal/Alertness: Awake/alert Behavior During Therapy: WFL for tasks assessed/performed Overall Cognitive Status: Within Functional Limits for tasks assessed                                     General Comments       Exercises     Shoulder Instructions      Home Living Family/patient expects to be discharged to:: Private residence Living Arrangements: Children Available Help at Discharge:  Family;Available 24 hours/day Type of Home: House Home Access: Stairs to enter [employer] of Steps: 6 Entrance Stairs-Rails: Right;Left Home Layout: One level     Bathroom Shower/Tub: Occupational psychologist: Standard     Home Equipment: Cane - single point;Grab bars - tub/shower;Grab bars - toilet;Shower seat - built in   Additional Comments: pt stays in her home during the day, and her daughter at night;  reports she can have 24/7 suppor      Prior Functioning/Environment Level of Independence: Independent        Comments: independent ADLs, mobility (using cane in community), cooks         OT Problem List:        OT Treatment/Interventions:      OT Goals(Current goals can be found in the care plan section) Acute Rehab OT Goals Patient Stated Goal: to get back home  OT Goal Formulation: With patient  OT Frequency:     Barriers to D/C:            Co-evaluation              AM-PAC OT "6 Clicks" Daily Activity     Outcome Measure Help from another person eating meals?: None Help from another person taking care of personal grooming?: None Help from another person toileting, which includes using toliet, bedpan, or urinal?: None Help from another person bathing (including washing, rinsing, drying)?: None Help from another person to put on and taking off regular upper body clothing?: None Help from another person to put on and taking off regular lower body clothing?: None 6 Click Score: 24   End of Session Nurse Communication: Mobility status  Activity Tolerance: Patient tolerated treatment well Patient left: in bed;with call bell/phone within reach;with bed alarm set  OT Visit Diagnosis: Other abnormalities of gait and mobility (R26.89)                Time: 4098-1191 OT Time Calculation (min): 18 min Charges:  OT General Charges $OT Visit: 1 Visit OT Evaluation $OT Eval Low Complexity: 1 Low  Delight Stare, OT Acute Rehabilitation Services Pager [phone] Office [phone]   Delight Stare 05/15/2019, 8:44 AM

## 2019-05-15 NOTE — Evaluation (Signed)
Physical Therapy Evaluation Patient Details Name: April Yang MRN: [id number] DOB: 10-27-1921 Today's Date: 05/15/2019   History of Present Illness  85 yo admitted with weakness, dyspnea, nausea. PMhx: CAD, ICM, MI, CHF, HLD  Clinical Impression  Pt very pleasant and wanting to walk and move. Pt does not use AD at home but she admits she frequently furniture walks and that she can get around in home because of familiar environment with low vision. Pt with decreased strength and balance who would benefit from continued use of RW for gait as well as acute therapy to maximize balance and safety. HHPT safety eval recommended for recommendations for safety and fall prevention in home as pt reports she is nervous with stairs. Pt safe for return home with above recommendations.     Follow Up Recommendations Home health PT(safety eval)    Equipment Recommendations  None recommended by PT    Recommendations for Other Services       Precautions / Restrictions Precautions Precautions: Fall      Mobility  Bed Mobility Overal bed mobility: Modified Independent             General bed mobility comments: HOb 20 degrees with increased time  Transfers Overall transfer level: Needs assistance   Transfers: Sit to/from Stand Sit to Stand: Supervision         General transfer comment: supervision for lines only  Ambulation/Gait Ambulation/Gait assistance: Supervision Gait Distance (Feet): 300 Feet Assistive device: Rolling walker (2 wheeled) Gait Pattern/deviations: Step-through pattern;Decreased stride length   Gait velocity interpretation: >2.62 ft/sec, indicative of community ambulatory General Gait Details: pt walking with RW 300' and able to walk in room with HHA or furniture walking but benefits from AD for balance and safety. Generally steady with cues for proximity to RW  Stairs Stairs: Yes Stairs assistance: Modified independent (Device/Increase time) Stair  Management: Step to pattern;Forwards;One rail Right Number of Stairs: 3 General stair comments: able to ascend with reliance on rail and discussed placing antiskip tape on edge of stairs for increased contrast at home due to vision impairments  Wheelchair Mobility    Modified Rankin (Stroke Patients Only)       Balance Overall balance assessment: Needs assistance   Sitting balance-Leahy Scale: Good       Standing balance-Leahy Scale: Fair Standing balance comment: pt able to stand without assist but reaches out for support for functional activities                             Pertinent Vitals/Pain Pain Assessment: No/denies pain    Home Living Family/patient expects to be discharged to:: Private residence Living Arrangements: Alone Available Help at Discharge: Family;Available 24 hours/day Type of Home: House Home Access: Stairs to enter Entrance Stairs-Rails: Left Entrance Stairs-Number of Steps: 6 Home Layout: One level Home Equipment: Cane - single point;Grab bars - tub/shower;Grab bars - toilet;Shower seat - built in;Walker - 2 wheels Additional Comments: pt stays in her home during the day, and her daughter stays at night; reports she can have 24/7 support    Prior Function Level of Independence: Independent         Comments: pt does not drive due to vision impairments, still cooks     Hand Dominance        Extremity/Trunk Assessment   Upper Extremity Assessment Upper Extremity Assessment: Generalized weakness    Lower Extremity Assessment Lower Extremity Assessment: Generalized weakness  Cervical / Trunk Assessment Cervical / Trunk Assessment: Other exceptions Cervical / Trunk Exceptions: rounded shoulders  Communication      Cognition Arousal/Alertness: Awake/alert Behavior During Therapy: WFL for tasks assessed/performed Overall Cognitive Status: Within Functional Limits for tasks assessed                                         General Comments      Exercises     Assessment/Plan    PT Assessment Patient needs continued PT services  PT Problem List Decreased mobility;Decreased strength;Decreased balance;Decreased knowledge of use of DME       PT Treatment Interventions Balance training;Functional mobility training;Therapeutic exercise;Patient/family education;Therapeutic activities;DME instruction    PT Goals (Current goals can be found in the Care Plan section)  Acute Rehab PT Goals Patient Stated Goal: return home today PT Goal Formulation: With patient Time For Goal Achievement: 05/22/19 Potential to Achieve Goals: Good    Frequency Min 3X/week   Barriers to discharge        Co-evaluation               AM-PAC PT "6 Clicks" Mobility  Outcome Measure Help needed turning from your back to your side while in a flat bed without using bedrails?: None Help needed moving from lying on your back to sitting on the side of a flat bed without using bedrails?: None Help needed moving to and from a bed to a chair (including a wheelchair)?: None Help needed standing up from a chair using your arms (e.g., wheelchair or bedside chair)?: None Help needed to walk in hospital room?: A Little Help needed climbing 3-5 steps with a railing? : A Little 6 Click Score: 22    End of Session   Activity Tolerance: Patient tolerated treatment well Patient left: in chair;with call bell/phone within reach Nurse Communication: Mobility status PT Visit Diagnosis: Other abnormalities of gait and mobility (R26.89);Unsteadiness on feet (R26.81);Muscle weakness (generalized) (M62.81)    Time: 9476-5465 PT Time Calculation (min) (ACUTE ONLY): 27 min   Charges:   PT Evaluation $PT Eval Moderate Complexity: 1 Mod          [place], PT Acute Rehabilitation Services Pager: [phone] Office: [phone]   Sandy Salaam Elysha Daw 05/15/2019, 12:13 PM

## 2019-05-15 NOTE — Progress Notes (Signed)
PROGRESS NOTE    April Yang  WVP:[id number] DOB: 1921/10/21 DOA: 05/14/2019 PCP: Mateo Flow, MD   Brief Narrative: 85 year old female with history of CAD/ICM with EF 45% history of S2 recent MI in May 2019 with PCI with DES to LAD x2 and 70-75% RCA disease comes to the ER for evaluation of generalized weakness/fatigue/dyspnea on exertion/nausea with burping, patient was concerned about her symptoms as they felt like her previous MI. In the ER troponin negative so far, EKG unchanged, nonischemic.  Patient is afebrile, chest x-ray with bibasilar infiltrates could be just come back negative placed on ceftriaxone through nursing for pneumonia and was admitted.  Respiratory virus panel was sent and pending.  Subjective: Patient this morning feels well but not eating well and has poor intake. Denies chest pain and shortness of breath.  Assessment & Plan:  Fatigue/dyspnea on exertion: Suspect in the setting of multiple comorbidities/deconditioning. ruled out ACS w serial negative troponins. CXR on admission bibasilar infiltrates, CT chest obtained that was unremarkable, procalcitonin negative.  Patient received Lasix 20 mg p.o. x1 given her CHF/elevated BNP.  holding off on further antibiotics.  She felt her symptoms similar to previous episode of ST elevation MI, will check echocardiogram as BNP is uptrending. Continue PT OT evaluation  AKI on CKD stage III baseline creatinine around 0.80 to 0.9: Creatinine trending up 1.2-1.5, gave IV fluids and repeated labs still uptrending at 1.7.  Likely from poor oral intake.  I will keep her on IV fluids hydration gently, check labs in am.Patient is tolerating oral fluids well denies any shortness of breath currently chest clear on exam. Recent Labs  Lab 05/14/19 0306 05/15/19 0520 05/15/19 1153  BUN 16 16 20   CREATININE 1.22* 1.59* 1.72*   Mildly elevated lactate of 1.9- 2.0 likely from dehydration. cont if Fluid as above.  No leukocytosis no fever  no other evidence of infection.  Blood culture negative so far  CAD/ischemic cardiomyopathy: On Plavix, Lipitor at home and will be continued.  Her echo in August 2019 has EF of 45%. Repeat echo as above.  Nausea on a as needed Zofran.  No nausea today.  Sinus bradycardia: Heart rate remains low, continue to hold metoprolol for now.  Hypothyroidism : Synthroid.  Hyperlipidemia: on statins  COVID 19 : negative on admission  DVT prophylaxis: lovenox sq Code Status: DNR  Family Communication: updated her daughter and discussed POC. Per daughter pt intermittently does not eat and drink well at home. Disposition Plan: Continue IV hydration given her poor oral intake and acute renal failure.  Repeat labs in the morning, patient is not safe for discharge today given her old age and/Frality.   Consultants:  None  Procedures: None  Antimicrobials: Anti-infectives (From admission, onward)   Start     Dose/Rate Route Frequency Ordered Stop   05/14/19 0515  cefTRIAXone (ROCEPHIN) 1 g in sodium chloride 0.9 % 100 mL IVPB     1 g 200 mL/hr over 30 Minutes Intravenous  Once 05/14/19 0506 05/14/19 0635   05/14/19 0515  azithromycin (ZITHROMAX) 500 mg in sodium chloride 0.9 % 250 mL IVPB     500 mg 250 mL/hr over 60 Minutes Intravenous  Once 05/14/19 0506 05/14/19 0738       Objective: Vitals:   05/15/19 0028 05/15/19 0512 05/15/19 0727 05/15/19 1206  BP:  (!) 124/53  (!) 101/53  Pulse: 72 (!) 50  62  Resp:    18  Temp:  98.7 F (37.1  C)  98.3 F (36.8 C)  TempSrc:  Oral  Oral  SpO2: 91% 94%  95%  Weight:   63.1 kg   Height:        Intake/Output Summary (Last 24 hours) at 05/15/2019 1352 Last data filed at 05/15/2019 0903 Gross per 24 hour  Intake 360 ml  Output 400 ml  Net -40 ml   Filed Weights   05/14/19 0253 05/14/19 2105 05/15/19 0727  Weight: 62.6 kg 65.7 kg 63.1 kg   Weight change: 3.104 kg  Body mass index is 23.15 kg/m.  Intake/Output from previous day:  06/15 0701 - 06/16 0700 In: 120 [P.O.:120] Out: 400 [Urine:400] Intake/Output this shift: Total I/O In: 240 [P.O.:240] Out: -   Examination:  General exam: Appears calm and comfortable, elderly, frail, not in acute distress HEENT:PERRL,Oral mucosa moist, Ear/Nose normal on gross exam Respiratory system: Bilateral equal air entry, normal vesicular breath sounds, no wheezes or crackles  Cardiovascular system: S1 & S2 heard,No JVD, murmurs. Gastrointestinal system: Abdomen is  soft, non tender, non distended, BS +  Nervous System:Alert and oriented. No focal neurological deficits/moving extremities, sensation intact. Extremities: No edema, no clubbing, distal peripheral pulses palpable. Skin: No rashes, lesions, no icterus MSK: Normal muscle bulk,tone ,power  Medications:  Scheduled Meds: . atorvastatin  40 mg Oral q1800  . clopidogrel  75 mg Oral Daily  . enoxaparin (LOVENOX) injection  30 mg Subcutaneous Q24H  . levothyroxine  75 mcg Oral Q0600   Continuous Infusions: . sodium chloride      Data Reviewed: I have personally reviewed following labs and imaging studies  CBC: Recent Labs  Lab 05/14/19 0306  WBC 7.5  NEUTROABS 6.0  HGB 11.6*  HCT 36.4  MCV 96.0  PLT 696   Basic Metabolic Panel: Recent Labs  Lab 05/14/19 0306 05/15/19 0520 05/15/19 1153  NA 137 138 137  K 3.6 3.8 3.9  CL 106 106 104  CO2 21* 24 24  GLUCOSE 142* 104* 105*  BUN 16 16 20   CREATININE 1.22* 1.59* 1.72*  CALCIUM 8.8* 8.8* 8.8*   GFR: Estimated Creatinine Clearance: 17.6 mL/min (A) (by C-G formula based on SCr of 1.72 mg/dL (H)). Liver Function Tests: Recent Labs  Lab 05/15/19 0520  AST 44*  ALT 25  ALKPHOS 157*  BILITOT 0.7  PROT 7.2  ALBUMIN 2.7*   No results for input(s): LIPASE, AMYLASE in the last 168 hours. No results for input(s): AMMONIA in the last 168 hours. Coagulation Profile: No results for input(s): INR, PROTIME in the last 168 hours. Cardiac Enzymes:  Recent Labs  Lab 05/14/19 0306 05/14/19 0530 05/14/19 1121 05/14/19 1700  TROPONINI <0.03 <0.03 <0.03 <0.03   BNP (last 3 results) No results for input(s): PROBNP in the last 8760 hours. HbA1C: No results for input(s): HGBA1C in the last 72 hours. CBG: No results for input(s): GLUCAP in the last 168 hours. Lipid Profile: No results for input(s): CHOL, HDL, LDLCALC, TRIG, CHOLHDL, LDLDIRECT in the last 72 hours. Thyroid Function Tests: Recent Labs    05/14/19 0540  TSH 1.215   Anemia Panel: No results for input(s): VITAMINB12, FOLATE, FERRITIN, TIBC, IRON, RETICCTPCT in the last 72 hours. Sepsis Labs: Recent Labs  Lab 05/14/19 0829 05/14/19 1121  PROCALCITON <0.10  --   LATICACIDVEN 1.9 2.0*    Recent Results (from the past 240 hour(s))  SARS Coronavirus 2     Status: None   Collection Time: 05/14/19  4:05 AM  Result Value Ref Range  Status   SARS Coronavirus 2 NOT DETECTED NOT DETECTED Final    Comment: (NOTE) SARS-CoV-2 target nucleic acids are NOT DETECTED. The SARS-CoV-2 RNA is generally detectable in upper and lower respiratory specimens during the acute phase of infection.  Negative  results do not preclude SARS-CoV-2 infection, do not rule out co-infections with other pathogens, and should not be used as the sole basis for treatment or other patient management decisions.  Negative results must be combined with clinical observations, patient history, and epidemiological information. The expected result is Not Detected. Fact Sheet for Patients: http://www.biofiredefense.com/wp-content/uploads/2020/03/BIOFIRE-COVID -19-patients.pdf Fact Sheet for Healthcare Providers: http://www.biofiredefense.com/wp-content/uploads/2020/03/BIOFIRE-COVID -19-hcp.pdf This test is not yet approved or cleared by the [place] and  has been authorized for detection and/or diagnosis of SARS-CoV-2 by FDA under an Emergency Use Authorization (EUA).  This EUA will remain in  effec t (meaning this test can be used) for the duration of  the COVID-19 declaration under Section 564(b)(1) of the Act, 21 U.S.C. section 360bbb-3(b)(1), unless the authorization is terminated or revoked sooner. Performed at [facility], [place] [address], [place], [place] [address]   Blood culture (routine x 2)     Status: None (Preliminary result)   Collection Time: 05/14/19  5:26 AM   Specimen: BLOOD  Result Value Ref Range Status   Specimen Description BLOOD RIGHT ANTECUBITAL  Final   Special Requests   Final    BOTTLES DRAWN AEROBIC AND ANAEROBIC Blood Culture results may not be optimal due to an inadequate volume of blood received in culture bottles   Culture   Final    NO GROWTH 1 DAY Performed at [facility], [place] [address], [place], [place] [address]    Report Status PENDING  Incomplete  Blood culture (routine x 2)     Status: None (Preliminary result)   Collection Time: 05/14/19  5:26 AM   Specimen: BLOOD RIGHT ARM  Result Value Ref Range Status   Specimen Description BLOOD RIGHT ARM  Final   Special Requests   Final    BOTTLES DRAWN AEROBIC AND ANAEROBIC Blood Culture adequate volume   Culture   Final    NO GROWTH 1 DAY Performed at [facility], [place] [address], [place],  [address]    Report Status PENDING  Incomplete      Radiology Studies: Ct Chest Wo Contrast  Result Date: 05/14/2019 CLINICAL DATA:  Pt had an episode of severe retching, burping yesterday, which were very similar symptoms to her MI last year. Pt denies any chest pain, or SOB EXAM: CT CHEST WITHOUT CONTRAST TECHNIQUE: Multidetector CT imaging of the chest was performed following the standard protocol without IV contrast. COMPARISON:  None. FINDINGS: Cardiovascular: Aortic atherosclerosis. No thoracic aortic aneurysm. Diffuse coronary artery calcifications, presumed coronary artery stent within the LEFT anterior descending coronary artery. Cardiomegaly. No pericardial  effusion. Mediastinum/Nodes: No mass or enlarged lymph nodes seen within the mediastinum or perihilar regions. Esophagus is unremarkable. Trachea and central bronchi are unremarkable. Lungs/Pleura: Mild bibasilar scarring/fibrosis. Additional biapical scarring/fibrosis. Lungs are otherwise clear. No confluent consolidation to suggest a developing pneumonia. No pleural effusion or pneumothorax. Upper Abdomen: Moderate RIGHT-sided hydronephrosis, incompletely imaged, however moderate dilatation of the RIGHT renal collecting system was also described on an abdomen ultrasound of 08/06/2009 suggesting a chronic benign process such as partial congenital UPJ obstruction. Musculoskeletal: Mild degenerative spurring throughout the thoracic spine. No acute or suspicious osseous finding. IMPRESSION: 1. No acute findings. No evidence of pneumonia  or pulmonary edema. Esophagus is unremarkable. 2. Cardiomegaly. Diffuse coronary artery calcifications, with presumed coronary artery stent within the left anterior descending coronary artery. Aortic Atherosclerosis (ICD10-I70.0). Electronically Signed   By: Franki Cabot M.D.   On: 05/14/2019 11:14   Dg Chest Portable 1 View  Result Date: 05/14/2019 CLINICAL DATA:  85 year old with burping. Weakness. EXAM: PORTABLE CHEST 1 VIEW COMPARISON:  None. FINDINGS: Low lung volumes. Nonspecific patchy bibasilar opacities. Normal heart size for technique. Aortic atherosclerosis. Mild biapical pleuroparenchymal scarring. No pulmonary edema, pleural fluid or pneumothorax. No acute osseous abnormalities are seen. IMPRESSION: 1. Low lung volumes with patchy bibasilar opacities, atelectasis versus pneumonia, including atypical viral infection. 2.  Aortic Atherosclerosis (ICD10-I70.0). Electronically Signed   By: Keith Rake M.D.   On: 05/14/2019 03:17      LOS: 0 days   Time spent: More than 50% of that time was spent in counseling and/or coordination of care.  Antonieta Pert, MD [facility]  05/15/2019, 1:52 PM

## 2019-05-15 NOTE — Progress Notes (Signed)
  Echocardiogram 2D Echocardiogram has been performed. April Yang 05/15/2019, 3:49 PM

## 2019-05-15 NOTE — Progress Notes (Signed)
Updated daughter, Encarnacion Slates [phone], on treatment and discharge plan.  Daughter stated that she had spoke to MD also today.  All questions answered and no concerns noted.

## 2019-05-16 ENCOUNTER — Encounter (HOSPITAL_COMMUNITY): Payer: Self-pay | Admitting: Cardiology

## 2019-05-16 DIAGNOSIS — R931 Abnormal findings on diagnostic imaging of heart and coronary circulation: Secondary | ICD-10-CM

## 2019-05-16 DIAGNOSIS — I252 Old myocardial infarction: Secondary | ICD-10-CM

## 2019-05-16 DIAGNOSIS — I2510 Atherosclerotic heart disease of native coronary artery without angina pectoris: Secondary | ICD-10-CM

## 2019-05-16 DIAGNOSIS — Z23 Encounter for immunization: Secondary | ICD-10-CM | POA: Diagnosis not present

## 2019-05-16 LAB — BASIC METABOLIC PANEL
Anion gap: 8 (ref 5–15)
BUN: 15 mg/dL (ref 8–23)
CO2: 23 mmol/L (ref 22–32)
Calcium: 8.7 mg/dL — ABNORMAL LOW (ref 8.9–10.3)
Chloride: 107 mmol/L (ref 98–111)
Creatinine, Ser: 1.07 mg/dL — ABNORMAL HIGH (ref 0.44–1.00)
GFR calc Af Amer: 51 mL/min — ABNORMAL LOW (ref >60)
GFR calc non Af Amer: 44 mL/min — ABNORMAL LOW (ref >60)
Glucose, Bld: 99 mg/dL (ref 70–99)
Potassium: 4 mmol/L (ref 3.5–5.1)
Sodium: 138 mmol/L (ref 135–145)

## 2019-05-16 MED ORDER — METOPROLOL TARTRATE 12.5 MG HALF TABLET: 12.5000 mg | ORAL_TABLET | Freq: Two times a day (BID) | ORAL | Status: DC

## 2019-05-16 MED ORDER — ATORVASTATIN CALCIUM 40 MG PO TABS: 40.0000 mg | ORAL_TABLET | Freq: Every day | ORAL | 0 refills | Status: DC

## 2019-05-16 NOTE — Progress Notes (Signed)
PROGRESS NOTE  April Yang QPY:[id number] DOB: [birth year] DOA: 05/14/2019 PCP: Mateo Flow, MD  HPI/Recap of past 24 hours: Brief Narrative: 85 year old female with history of CAD/ICM with EF 45% history of S2 recent MI in May 2019 with PCI with DES to LAD x2 and 70-75% RCA disease comes to the ER for evaluation of generalized weakness/fatigue/dyspnea on exertion/nausea with burping, patient was concerned about her symptoms as they felt like her previous MI. In the ER troponin negative so far, EKG unchanged, nonischemic. Patient is afebrile, chest x-ray with bibasilar infiltrates could be just come back negative placed on ceftriaxone through nursing for pneumonia and was admitted.  Respiratory virus panel was sent and pending.  05/16/19: Patient seen and examined at her bedside.  Reports persistent generalized weakness.  Belching has resolved.  Last 2D echo done on 05/15/2019 showed decreased LVEF 35 to 40% with severe akinesis from LVEF 45% 1 year ago.. Cardiology consulted to further assess..   Assessment/Plan: Principal Problem:   Bilateral pulmonary infiltrates on chest x-ray Active Problems:   Ischemic cardiomyopathy   Hyperlipidemia   Chronic combined systolic and diastolic CHF (congestive heart failure) (HCC)   Bradycardia   Nausea  Generalized weakness/fatigue likely deconditioning in the setting of multiple comorbidities. Trop negative x 4 Worsening LVEF on 2D echo 05/15/19 She felt her symptoms similar to previous episode of ST elevation MI, will check echocardiogram as BNP is uptrending. Continue PT OT evaluation  Ischemic CM/ Acute systolic CHF Last 2D echo 05/15/19 showed LVEF 35% with severe akinesis prwevious echo in 2019 LVEf 45% Not on diuretic euvolemic on exam Cardiology consulted to assist in the management   Resolving AKI on CKD stage III baseline creatinine around 0.80 to 0.9:  Cr improved 1.07 on 05/16/2019 from 1.72 yesterday.   Last Labs         Recent Labs  Lab 05/14/19 0306 05/15/19 0520 05/15/19 1153  BUN 16 16 20   CREATININE 1.22* 1.59* 1.72*     Mildly elevated lactate of 1.9- 2.0 likely from dehydration. cont if Fluid as above.  No leukocytosis no fever no other evidence of infection.  Blood culture negative so far  CAD/ischemic cardiomyopathy:Continue Plavix, Lipitor.  Resolved nausea on a as needed Zofran.  Resolved sInus bradycardia:  Metoprolol held due to bradycardia.  Defer to cardiology to restart BB.  Hypothyroidism :  Continue Synthroid.  Hyperlipidemia: Continue statins  COVID 19 : negative on admission  DVT prophylaxis: lovenox sq Code Status: DNR  Family Communication: updated her daughter and discussed POC. Per daughter pt intermittently does not eat and drink well at home. Disposition Plan:   Discharge to home with home health PT when cardiology signs off.  Consultants:  Cardiology  Procedures: None   Objective: Vitals:   05/16/19 0531 05/16/19 0642 05/16/19 0846 05/16/19 1046  BP: (!) 123/51  (!) 142/59 112/75  Pulse: 71  73 72  Resp: 20  17   Temp: 98.1 F (36.7 C)  98.3 F (36.8 C)   TempSrc: Oral  Oral   SpO2: 91%  96% 95%  Weight:  64.5 kg    Height:        Intake/Output Summary (Last 24 hours) at 05/16/2019 1154 Last data filed at 05/16/2019 1046 Gross per 24 hour  Intake 2444.93 ml  Output 1400 ml  Net 1044.93 ml   Filed Weights   05/14/19 2105 05/15/19 0727 05/16/19 0642  Weight: 65.7 kg 63.1 kg 64.5 kg    Exam:  .  General: 85 y.o. year-old female pleasant and weak appearing in no acute distress.  Alert and interactive. . Cardiovascular: Regular rate and rhythm with no rubs or gallops.  No thyromegaly or JVD noted.   Marland Kitchen Respiratory: Clear to auscultation with no wheezes or rales. Good inspiratory effort. . Abdomen: Soft nontender nondistended with normal bowel sounds x4 quadrants. . Musculoskeletal: Trace lower extremity edema. 2/4 pulses in all 4  extremities. Marland Kitchen Psychiatry: Mood is appropriate for condition and setting   Data Reviewed: CBC: Recent Labs  Lab 05/14/19 0306  WBC 7.5  NEUTROABS 6.0  HGB 11.6*  HCT 36.4  MCV 96.0  PLT 761   Basic Metabolic Panel: Recent Labs  Lab 05/14/19 0306 05/15/19 0520 05/15/19 1153 05/16/19 0753  NA 137 138 137 138  K 3.6 3.8 3.9 4.0  CL 106 106 104 107  CO2 21* 24 24 23   GLUCOSE 142* 104* 105* 99  BUN 16 16 20 15   CREATININE 1.22* 1.59* 1.72* 1.07*  CALCIUM 8.8* 8.8* 8.8* 8.7*   GFR: Estimated Creatinine Clearance: 28.3 mL/min (A) (by C-G formula based on SCr of 1.07 mg/dL (H)). Liver Function Tests: Recent Labs  Lab 05/15/19 0520  AST 44*  ALT 25  ALKPHOS 157*  BILITOT 0.7  PROT 7.2  ALBUMIN 2.7*   No results for input(s): LIPASE, AMYLASE in the last 168 hours. No results for input(s): AMMONIA in the last 168 hours. Coagulation Profile: No results for input(s): INR, PROTIME in the last 168 hours. Cardiac Enzymes: Recent Labs  Lab 05/14/19 0306 05/14/19 0530 05/14/19 1121 05/14/19 1700  TROPONINI <0.03 <0.03 <0.03 <0.03   BNP (last 3 results) No results for input(s): PROBNP in the last 8760 hours. HbA1C: No results for input(s): HGBA1C in the last 72 hours. CBG: No results for input(s): GLUCAP in the last 168 hours. Lipid Profile: No results for input(s): CHOL, HDL, LDLCALC, TRIG, CHOLHDL, LDLDIRECT in the last 72 hours. Thyroid Function Tests: Recent Labs    05/14/19 0540  TSH 1.215   Anemia Panel: No results for input(s): VITAMINB12, FOLATE, FERRITIN, TIBC, IRON, RETICCTPCT in the last 72 hours. Urine analysis: No results found for: COLORURINE, APPEARANCEUR, LABSPEC, PHURINE, GLUCOSEU, HGBUR, BILIRUBINUR, KETONESUR, PROTEINUR, UROBILINOGEN, NITRITE, LEUKOCYTESUR Sepsis Labs: @LABRCNTIP (procalcitonin:4,lacticidven:4)  ) Recent Results (from the past 240 hour(s))  SARS Coronavirus 2     Status: None   Collection Time: 05/14/19  4:05 AM   Result Value Ref Range Status   SARS Coronavirus 2 NOT DETECTED NOT DETECTED Final    Comment: (NOTE) SARS-CoV-2 target nucleic acids are NOT DETECTED. The SARS-CoV-2 RNA is generally detectable in upper and lower respiratory specimens during the acute phase of infection.  Negative  results do not preclude SARS-CoV-2 infection, do not rule out co-infections with other pathogens, and should not be used as the sole basis for treatment or other patient management decisions.  Negative results must be combined with clinical observations, patient history, and epidemiological information. The expected result is Not Detected. Fact Sheet for Patients: http://www.biofiredefense.com/wp-content/uploads/2020/03/BIOFIRE-COVID -19-patients.pdf Fact Sheet for Healthcare Providers: http://www.biofiredefense.com/wp-content/uploads/2020/03/BIOFIRE-COVID -19-hcp.pdf This test is not yet approved or cleared by the [place] and  has been authorized for detection and/or diagnosis of SARS-CoV-2 by FDA under an Emergency Use Authorization (EUA).  This EUA will remain in effec t (meaning this test can be used) for the duration of  the COVID-19 declaration under Section 564(b)(1) of the Act, 21 U.S.C. section 360bbb-3(b)(1), unless the authorization is terminated or revoked sooner. Performed at  [facility], [place] [address], [place], [place] [address]   Blood culture (routine x 2)     Status: None (Preliminary result)   Collection Time: 05/14/19  5:26 AM   Specimen: BLOOD  Result Value Ref Range Status   Specimen Description BLOOD RIGHT ANTECUBITAL  Final   Special Requests   Final    BOTTLES DRAWN AEROBIC AND ANAEROBIC Blood Culture results may not be optimal due to an inadequate volume of blood received in culture bottles   Culture   Final    NO GROWTH 1 DAY Performed at [facility], [place] [address] [place], [place] [address]    Report Status PENDING  Incomplete  Blood culture  (routine x 2)     Status: None (Preliminary result)   Collection Time: 05/14/19  5:26 AM   Specimen: BLOOD RIGHT ARM  Result Value Ref Range Status   Specimen Description BLOOD RIGHT ARM  Final   Special Requests   Final    BOTTLES DRAWN AEROBIC AND ANAEROBIC Blood Culture adequate volume   Culture   Final    NO GROWTH 1 DAY Performed at [facility], [place] [address] [place], [place] [address]    Report Status PENDING  Incomplete      Studies: No results found.  Scheduled Meds: . atorvastatin  40 mg Oral q1800  . clopidogrel  75 mg Oral Daily  . enoxaparin (LOVENOX) injection  30 mg Subcutaneous Q24H  . levothyroxine  75 mcg Oral Q0600    Continuous Infusions:   LOS: 1 day     Kayleen Memos, MD [facility] Pager [phone]  If 7PM-7AM, please contact night-coverage [email or web address] Password [facility] 05/16/2019, 11:54 AM

## 2019-05-16 NOTE — Progress Notes (Signed)
Spoke with patient's daughter Encarnacion Slates on the phone regarding [place] services. She declined. Stated she and her sister will be with the patient and will provide supervision and do physical exercises with her.  She also stated they really did not want to go back to getting home health services. Patient received home health services last year after her STEMI.

## 2019-05-16 NOTE — Discharge Summary (Addendum)
Discharge Summary  April Yang:[id number] DOB: February 28, 1921  PCP: Mateo Flow, MD  Admit date: 05/14/2019 Discharge date: 05/16/2019  Time spent: 35 minutes   Recommendations for Outpatient Follow-up:  1. Follow up with your cardiologist. 2. Follow up with your PCP. 3. Take your medications as prescribed.  Discharge Diagnoses:  Active Hospital Problems   Diagnosis Date Noted   Bilateral pulmonary infiltrates on chest x-ray 05/14/2019   Bradycardia 05/14/2019   Nausea 05/14/2019   Chronic combined systolic and diastolic CHF (congestive heart failure) ([place]) 05/09/2018   Ischemic cardiomyopathy 05/09/2018   Hyperlipidemia 05/09/2018    Resolved Hospital Problems  No resolved problems to display.    Discharge Condition: Stable  Diet recommendation: Resume previous diet; Heart healthy diet.  Vitals:   05/16/19 1214 05/16/19 1544  BP: (!) 134/54 124/64  Pulse: 76 83  Resp: 18 16  Temp: (!) 97.5 F (36.4 C) 98.1 F (36.7 C)  SpO2: 99% 96%    History of present illness:  Brief Narrative: 85 year old female with history of CAD/ICM with EF 45% history of S2 recent MI in May 2019 with PCI with DES to LAD x2 and 70-75% RCA disease comes to the ER for evaluation of generalized weakness/fatigue/dyspnea on exertion/nausea with burping, patient was concerned about her symptoms as they felt like her previous MI. In the ER troponin negative so far, EKG unchanged, nonischemic. Patient is afebrile, chest x-ray with bibasilar infiltrates could be just come back negative placed on ceftriaxone through nursing for pneumonia and was admitted.Respiratory virus panel was sent and pending.  05/16/19: Patient seen and examined at her bedside.  Reports persistent generalized weakness.  Belching has resolved.  Last 2D echo done on 05/15/2019 showed decreased LVEF 35 to 40% with severe akinesis from LVEF 45% 1 year ago. Cardiology consulted to further assess.  Seen by cardiology. Ok  to go home, restart metoprol and follow up with your cardiologist.  On the day of discharge, the patient was hemodynamically stable. Spoke to her daughter Danton Clap on the phone and she will monitor her heart rate at home. Ordered DME pulse oximeter for heart rate monitoring at home.   Hospital Course:  Principal Problem:   Bilateral pulmonary infiltrates on chest x-ray Active Problems:   Ischemic cardiomyopathy   Hyperlipidemia   Chronic combined systolic and diastolic CHF (congestive heart failure) ([facility])   Bradycardia   Nausea  Generalized weakness/fatigue likely deconditioning in the setting of multiple comorbidities. Trop negative x 4 Worsening LVEF on 2D echo 05/15/19 She felt her symptoms similar to previous episode of ST elevation MI,will check echocardiogram as BNP is uptrending. Continue PT OT evaluation  Ischemic CM/ Acute systolic CHF Last 2D echo 05/15/19 showed LVEF 35% with severe akinesis prwevious echo in 2019 LVEf 45% Not on diuretic euvolemic on exam Cardiology consulted to assist in the management  Follow up with your cardiologist outpatient  Resolving AKI on CKD stage IIIbaseline creatinine around 0.80 to 0.9: Cr improved 1.07 on 05/16/2019 from 1.72 yesterday. Follow up with your PCP.   Mildly elevated lactate of1.9-2.0likely from dehydration. cont ifFluid as above. No leukocytosis no fever no other evidence of infection. Blood culture negative so far  CAD/ischemic cardiomyopathy:Continue Plavix, Lipitor.  Resolved nauseaWas on as needed Zofran. Tolerating po intake and diet.  Resolved sInus bradycardia: Metoprolol held due to bradycardia with HR as low as 39.  Defer to cardiology to restart BB. Cardiology restarted metoprolol on 05/16/19.  Follow up with your cardiologist and call  if HR drops below 60. Discussed with Daughter Danton Clap who understands and agrees to plan.  Hypothyroidism: Continue Synthroid.  Hyperlipidemia: Continue  statins  COVID19 : negativeon admission  Code Status:DNR  Family Communication:Updated her daughter Danton Clap on the phone on 05/16/19 who stated that family will monitor her heart rate at home. Instructed to call her cardiologist if heart rate drops below 60.   Disposition Plan:  Discharge to home with home health PT when cardiology signs off.  Consultants: Cardiology  Procedures: None   Discharge Exam: BP 124/64 (BP Location: Left Arm)    Pulse 83    Temp 98.1 F (36.7 C) (Oral)    Resp 16    Ht 5\' 5"  (1.651 m)    Wt 64.5 kg    LMP  (Exact Date)    SpO2 96%    BMI 23.68 kg/m   General: 85 y.o. year-old female well developed well nourished in no acute distress.  Alert and oriented x3.  Cardiovascular: Regular rate and rhythm with no rubs or gallops.  No thyromegaly or JVD noted.    Respiratory: Clear to auscultation with no wheezes or rales. Good inspiratory effort.  Abdomen: Soft nontender nondistended with normal bowel sounds x4 quadrants.  Musculoskeletal: No lower extremity edema. 2/4 pulses in all 4 extremities.  Skin: No ulcerative lesions noted or rashes,  Psychiatry: Mood is appropriate for condition and setting  Discharge Instructions You were cared for by a hospitalist during your hospital stay. If you have any questions about your discharge medications or the care you received while you were in the hospital after you are discharged, you can call the unit and asked to speak with the hospitalist on call if the hospitalist that took care of you is not available. Once you are discharged, your primary care physician will handle any further medical issues. Please note that NO REFILLS for any discharge medications will be authorized once you are discharged, as it is imperative that you return to your primary care physician (or establish a relationship with a primary care physician if you do not have one) for your aftercare needs so that they can reassess your need for  medications and monitor your lab values.   Allergies as of 05/16/2019   No Known Allergies     Medication List    STOP taking these medications   ticagrelor 90 MG Tabs tablet Commonly known as: BRILINTA     TAKE these medications   atorvastatin 40 MG tablet Commonly known as: LIPITOR Take 1 tablet (40 mg total) by mouth daily at 6 PM. What changed:   medication strength  how much to take   clopidogrel 75 MG tablet Commonly known as: PLAVIX Take 1 tablet (75 mg total) by mouth daily.   levothyroxine 75 MCG tablet Commonly known as: SYNTHROID Take 75 mcg by mouth daily before breakfast.   metoprolol tartrate 25 MG tablet Commonly known as: LOPRESSOR Take 0.5 tablets (12.5 mg total) by mouth 2 (two) times daily.   nitroGLYCERIN 0.4 MG SL tablet Commonly known as: Nitrostat Place 1 tablet (0.4 mg total) under the tongue every 5 (five) minutes as needed. What changed: reasons to take this            Durable Medical Equipment  (From admission, onward)         Start     Ordered   05/16/19 1614  For home use only DME Pulse oximeter  Once    Comments: Will be used to  check her HR.   05/16/19 1614         No Known Allergies Follow-up Information    Jettie Booze, MD Follow up on 06/18/2019.   Specialties: Cardiology, Radiology, Interventional Cardiology Why: You have been scheduled for a telemedicine visit with Dr. Irish Lack 06/18/2019 at 11:20am. You will be contacted 2-3 days prior to your appointment with additional instructions.  Contact information: [address] [place] [address] [phone]        Mateo Flow, MD. Call in 1 day(s).   Specialty: Family Medicine Why: Please call for a post hospital follow up appointment. Contact information: [place] [address] [phone]            The results of significant diagnostics from this hospitalization (including imaging, microbiology,  ancillary and laboratory) are listed below for reference.    Significant Diagnostic Studies: Ct Chest Wo Contrast  Result Date: 05/14/2019 CLINICAL DATA:  Pt had an episode of severe retching, burping yesterday, which were very similar symptoms to her MI last year. Pt denies any chest pain, or SOB EXAM: CT CHEST WITHOUT CONTRAST TECHNIQUE: Multidetector CT imaging of the chest was performed following the standard protocol without IV contrast. COMPARISON:  None. FINDINGS: Cardiovascular: Aortic atherosclerosis. No thoracic aortic aneurysm. Diffuse coronary artery calcifications, presumed coronary artery stent within the LEFT anterior descending coronary artery. Cardiomegaly. No pericardial effusion. Mediastinum/Nodes: No mass or enlarged lymph nodes seen within the mediastinum or perihilar regions. Esophagus is unremarkable. Trachea and central bronchi are unremarkable. Lungs/Pleura: Mild bibasilar scarring/fibrosis. Additional biapical scarring/fibrosis. Lungs are otherwise clear. No confluent consolidation to suggest a developing pneumonia. No pleural effusion or pneumothorax. Upper Abdomen: Moderate RIGHT-sided hydronephrosis, incompletely imaged, however moderate dilatation of the RIGHT renal collecting system was also described on an abdomen ultrasound of 08/06/2009 suggesting a chronic benign process such as partial congenital UPJ obstruction. Musculoskeletal: Mild degenerative spurring throughout the thoracic spine. No acute or suspicious osseous finding. IMPRESSION: 1. No acute findings. No evidence of pneumonia or pulmonary edema. Esophagus is unremarkable. 2. Cardiomegaly. Diffuse coronary artery calcifications, with presumed coronary artery stent within the left anterior descending coronary artery. Aortic Atherosclerosis (ICD10-I70.0). Electronically Signed   By: Franki Cabot M.D.   On: 05/14/2019 11:14   Dg Chest Portable 1 View  Result Date: 05/14/2019 CLINICAL DATA:  85 year old with burping.  Weakness. EXAM: PORTABLE CHEST 1 VIEW COMPARISON:  None. FINDINGS: Low lung volumes. Nonspecific patchy bibasilar opacities. Normal heart size for technique. Aortic atherosclerosis. Mild biapical pleuroparenchymal scarring. No pulmonary edema, pleural fluid or pneumothorax. No acute osseous abnormalities are seen. IMPRESSION: 1. Low lung volumes with patchy bibasilar opacities, atelectasis versus pneumonia, including atypical viral infection. 2.  Aortic Atherosclerosis (ICD10-I70.0). Electronically Signed   By: Keith Rake M.D.   On: 05/14/2019 03:17    Microbiology: Recent Results (from the past 240 hour(s))  SARS Coronavirus 2     Status: None   Collection Time: 05/14/19  4:05 AM  Result Value Ref Range Status   SARS Coronavirus 2 NOT DETECTED NOT DETECTED Final    Comment: (NOTE) SARS-CoV-2 target nucleic acids are NOT DETECTED. The SARS-CoV-2 RNA is generally detectable in upper and lower respiratory specimens during the acute phase of infection.  Negative  results do not preclude SARS-CoV-2 infection, do not rule out co-infections with other pathogens, and should not be used as the sole basis for treatment or other patient management decisions.  Negative results must be combined with  clinical observations, patient history, and epidemiological information. The expected result is Not Detected. Fact Sheet for Patients: http://www.biofiredefense.com/wp-content/uploads/2020/03/BIOFIRE-COVID -19-patients.pdf Fact Sheet for Healthcare Providers: http://www.biofiredefense.com/wp-content/uploads/2020/03/BIOFIRE-COVID -19-hcp.pdf This test is not yet approved or cleared by the [place] and  has been authorized for detection and/or diagnosis of SARS-CoV-2 by FDA under an Emergency Use Authorization (EUA).  This EUA will remain in effec t (meaning this test can be used) for the duration of  the COVID-19 declaration under Section 564(b)(1) of the Act, 21 U.S.C. section  360bbb-3(b)(1), unless the authorization is terminated or revoked sooner. Performed at [facility], [place] [address] [place], [place] [address]   Blood culture (routine x 2)     Status: None (Preliminary result)   Collection Time: 05/14/19  5:26 AM   Specimen: BLOOD  Result Value Ref Range Status   Specimen Description BLOOD RIGHT ANTECUBITAL  Final   Special Requests   Final    BOTTLES DRAWN AEROBIC AND ANAEROBIC Blood Culture results may not be optimal due to an inadequate volume of blood received in culture bottles   Culture   Final    NO GROWTH 2 DAYS Performed at [facility], [place] [address] [place], [place] [address]    Report Status PENDING  Incomplete  Blood culture (routine x 2)     Status: None (Preliminary result)   Collection Time: 05/14/19  5:26 AM   Specimen: BLOOD RIGHT ARM  Result Value Ref Range Status   Specimen Description BLOOD RIGHT ARM  Final   Special Requests   Final    BOTTLES DRAWN AEROBIC AND ANAEROBIC Blood Culture adequate volume   Culture   Final    NO GROWTH 2 DAYS Performed at [facility], [place] [address] [place], [place] [address]    Report Status PENDING  Incomplete     Labs: Basic Metabolic Panel: Recent Labs  Lab 05/14/19 0306 05/15/19 0520 05/15/19 1153 05/16/19 0753  NA 137 138 137 138  K 3.6 3.8 3.9 4.0  CL 106 106 104 107  CO2 21* 24 24 23   GLUCOSE 142* 104* 105* 99  BUN 16 16 20 15   CREATININE 1.22* 1.59* 1.72* 1.07*  CALCIUM 8.8* 8.8* 8.8* 8.7*   Liver Function Tests: Recent Labs  Lab 05/15/19 0520  AST 44*  ALT 25  ALKPHOS 157*  BILITOT 0.7  PROT 7.2  ALBUMIN 2.7*   No results for input(s): LIPASE, AMYLASE in the last 168 hours. No results for input(s): AMMONIA in the last 168 hours. CBC: Recent Labs  Lab 05/14/19 0306  WBC 7.5  NEUTROABS 6.0  HGB 11.6*  HCT 36.4  MCV 96.0  PLT 239   Cardiac Enzymes: Recent Labs  Lab 05/14/19 0306 05/14/19 0530 05/14/19 1121 05/14/19 1700   TROPONINI <0.03 <0.03 <0.03 <0.03   BNP: BNP (last 3 results) Recent Labs    05/14/19 0540 05/15/19 0520  BNP 414.3* 685.1*    ProBNP (last 3 results) No results for input(s): PROBNP in the last 8760 hours.  CBG: No results for input(s): GLUCAP in the last 168 hours.     Signed:  Kayleen Memos, MD [facility] 05/16/2019, 4:40 PM

## 2019-05-16 NOTE — TOC Initial Note (Signed)
Transition of Care [facility]) - Initial/Assessment Note    Patient Details  Name: April Yang MRN: [id number] Date of Birth: 1920/12/14  [facility] CM/SW Contact:    Eileen Stanford, LCSW Phone Number: 05/16/2019, 4:57 PM  Clinical Narrative:  CSW spoke with pt at bedside. Pt is agreeable to [facility]. Pt has choosen [facility]. Referall provided. Pt has a walker and a bedside cammode at home. Pt uses at [facility] in [place].                 Expected Discharge Plan: [place] Barriers to Discharge: No Barriers Identified   Patient Goals and CMS Choice Patient states their goals for this hospitalization and ongoing recovery are:: "go on living life like i been" CMS Medicare.gov Compare Post Acute Care list provided to:: Patient Choice offered to / list presented to : Patient  Expected Discharge Plan and Services Expected Discharge Plan: [place] In-house Referral: NA   Post Acute Care Choice: [place] arrangements for the past 2 months: Single Family Home Expected Discharge Date: 05/16/19                         HH Arranged: RN, PT [place] Agency: [place] ([facility])        Prior Living Arrangements/Services Living arrangements for the past 2 months: Single Family Home Lives with:: Self, Adult Children Patient language and need for interpreter reviewed:: Yes Do you feel safe going back to the place where you live?: Yes      Need for Family Participation in Patient Care: Yes (Comment) Care giver support system in place?: Yes (comment)   Criminal Activity/Legal Involvement Pertinent to Current Situation/Hospitalization: No - Comment as needed  Activities of Daily Living Home Assistive Devices/Equipment: Built-in shower seat, Cane (specify quad or straight) ADL Screening (condition at time of admission) Patient's cognitive ability adequate to safely complete daily activities?: No Is the patient deaf  or have difficulty hearing?: No Does the patient have difficulty seeing, even when wearing glasses/contacts?: No Does the patient have difficulty concentrating, remembering, or making decisions?: Yes Patient able to express need for assistance with ADLs?: Yes Does the patient have difficulty dressing or bathing?: No Independently performs ADLs?: Yes (appropriate for developmental age) Does the patient have difficulty walking or climbing stairs?: Yes Weakness of Legs: None Weakness of Arms/Hands: None  Permission Sought/Granted Permission sought to share information with : Family Supports, Customer service manager    Share Information with NAME: Stanton Kidney  Permission granted to share info w AGENC[facility]: [facility]  Permission granted to share info w Relationship: daughter     Emotional Assessment Appearance:: Appears stated age Attitude/Demeanor/Rapport: (pt was appropriate) Affect (typically observed): Accepting, Appropriate, Calm Orientation: : Oriented to Situation, Oriented to  Time, Oriented to Place, Oriented to Self Alcohol / Substance Use: Not Applicable Psych Involvement: No (comment)  Admission diagnosis:  Community acquired pneumonia, unspecified laterality [J18.9] Patient Active Problem List   Diagnosis Date Noted  . Bilateral pulmonary infiltrates on chest x-ray 05/14/2019  . Bradycardia 05/14/2019  . Nausea 05/14/2019  . Ischemic cardiomyopathy 05/09/2018  . Hyperlipidemia 05/09/2018  . Chronic combined systolic and diastolic CHF (congestive heart failure) ([place]) 05/09/2018  . Acute anterior wall MI ([place]) 04/25/2018   PCP:  Mateo Flow, MD Pharmacy:   [facility] [address] [place]  [place] Phone: [phone] Fax: [phone]     Social Determinants of Health (SDOH) Interventions    Readmission Risk Interventions No flowsheet data found.

## 2019-05-16 NOTE — Plan of Care (Signed)
  Problem: Education: Goal: Knowledge of General Education information will improve Description Including pain rating scale, medication(s)/side effects and non-pharmacologic comfort measures Outcome: Progressing   

## 2019-05-16 NOTE — Consult Note (Addendum)
Cardiology Consultation:   Patient ID: ICYSS SKOG; [id number]; Oct 07, 1921   Admit date: 05/14/2019 Date of Consult: 05/16/2019  Primary Care Provider: Mateo Flow, MD Primary Cardiologist: Larae Grooms, MD Primary Electrophysiologist:  None   Patient Profile:   April Yang is a 85 y.o. female with a PMH of CAD s/p STEMI with PCI/DES to LAD 03/2018, Chronic combined CHF (EF 45% 06/2018), HLD, and hypothyroidism, who is being seen today for the evaluation of acute on chronic combined CHF at the request of Dr. Nevada Crane.  History of Present Illness:   April Yang was in her usual state of health until this past weekend when she began experiencing generalized weakness, fatigue, nausea, and belching. She reported similar symptoms with her STEMI 03/2018, prompting her to present to the ED for further evaluation.   She was last evaluated by cardiology via a telemedicine visit with Dr. Irish Lack 04/26/2019, at which time she was without cardiac complaints. Given 1 year mark from her STEMI, she was transitioned from brilinta to plavix. Her last echo 06/2018 showed EF 45% with Mid to apical anteroseptal akinesis and apical anterior akinesis, and moderate MR. Her last ischemic evaluation was a LHC 03/2018 to evaluate STEMI which showed 100% LAD stenosis managed with PCI/DES, and 75% RCA stenosis and 25% LCx stenosis medically managed.   At the time of this evaluation, she reports resolution of her symptoms. She received a GI cocktail in the ED and symptoms resolved. She reports being fairly active for a 85 yo - still working in her garden, performing ADLs, cooking, doing laundry, and some cleaning, all without anginal complaints. No complaints of chest pain, SOB, orthopnea, PND, LE edema, dizziness, lightheadedness, or syncope.   Hospital course: VSS. Labs notable for electrolytes wnl, Cr 1.22>1.07, AST 44, ALT wnl, WBC 7.5, Hgb 11.6, pLT 239, TSH wnl, procal <0.10, Lactate 2.0, Trop negative x4, BNP  202>542. CXR with bibasilar opacities. CT Chest without PNA or pulmonary edema. EKG with sinus rhythm with PACs, TWI in V2-3 (chronic), no STE/D. She received 1x dose of CTX in ED for possible PNA and was admitted to medicine. She received IV lasix 20mg  x1 dose for possible acute on chronic CHF. UOP with net +1L this admission. Echo 05/16/2019 with EF 35-40% (down from 45% 06/2018). Cardiology asked to evaluate for acute on chronic combined CHF.   Past Medical History:  Diagnosis Date   Acute anterior wall MI ([place]) 04/25/2018   CAD in native artery    a. cath 100% pLAD s/p DES x2. medical managment of 70% RCA   HLD (hyperlipidemia)    Hypothyroidism    Ischemic cardiomyopathy     Past Surgical History:  Procedure Laterality Date   CORONARY STENT INTERVENTION N/A 04/25/2018   Procedure: CORONARY STENT INTERVENTION;  Surgeon: Jettie Booze, MD;  Location: [facility];  Service: Cardiovascular;  Laterality: N/A;   LEFT HEART CATH AND CORONARY ANGIOGRAPHY N/A 04/25/2018   Procedure: LEFT HEART CATH AND CORONARY ANGIOGRAPHY;  Surgeon: Jettie Booze, MD;  Location: [facility];  Service: Cardiovascular;  Laterality: N/A;     Home Medications:  Prior to Admission medications   Medication Sig Start Date End Date Taking? Authorizing Provider  atorvastatin (LIPITOR) 80 MG tablet Take 1 tablet (80 mg total) by mouth daily at 6 PM. Patient taking differently: Take 40 mg by mouth every evening.  09/07/18  Yes Jettie Booze, MD  levothyroxine (SYNTHROID, LEVOTHROID) 75 MCG tablet Take  75 mcg by mouth daily before breakfast. 03/10/18  Yes [provider]  metoprolol tartrate (LOPRESSOR) 25 MG tablet Take 0.5 tablets (12.5 mg total) by mouth 2 (two) times daily. 09/07/18  Yes Jettie Booze, MD  nitroGLYCERIN (NITROSTAT) 0.4 MG SL tablet Place 1 tablet (0.4 mg total) under the tongue every 5 (five) minutes as needed. Patient taking differently: Place  0.4 mg under the tongue every 5 (five) minutes as needed for chest pain.  04/28/18  Yes Bhagat, Bhavinkumar, PA  ticagrelor (BRILINTA) 90 MG TABS tablet Take 90 mg by mouth 2 (two) times daily.   Yes [provider]  clopidogrel (PLAVIX) 75 MG tablet Take 1 tablet (75 mg total) by mouth daily. 04/26/19   Jettie Booze, MD    Inpatient Medications: Scheduled Meds:  atorvastatin  40 mg Oral q1800   clopidogrel  75 mg Oral Daily   enoxaparin (LOVENOX) injection  30 mg Subcutaneous Q24H   levothyroxine  75 mcg Oral Q0600   Continuous Infusions:  PRN Meds: acetaminophen **OR** acetaminophen, ondansetron **OR** ondansetron (ZOFRAN) IV, traMADol  Allergies:   No Known Allergies  Social History:   Social History   Socioeconomic History   Marital status: Married    Spouse name: Not on file   Number of children: Not on file   Years of education: Not on file   Highest education level: Not on file  Occupational History   Not on file  Social Needs   Financial resource strain: Not on file   Food insecurity    Worry: Not on file    Inability: Not on file   Transportation needs    Medical: Not on file    Non-medical: Not on file  Tobacco Use   Smoking status: Never Smoker   Smokeless tobacco: Never Used  Substance and Sexual Activity   Alcohol use: Not on file   Drug use: Not on file   Sexual activity: Not on file  Lifestyle   Physical activity    Days per week: Not on file    Minutes per session: Not on file   Stress: Not on file  Relationships   Social connections    Talks on phone: Not on file    Gets together: Not on file    Attends religious service: Not on file    Active member of club or organization: Not on file    Attends meetings of clubs or organizations: Not on file    Relationship status: Not on file   Intimate partner violence    Fear of current or ex partner: Not on file    Emotionally abused: Not on file    Physically  abused: Not on file    Forced sexual activity: Not on file  Other Topics Concern   Not on file  Social History Narrative   Not on file   Social History   Tobacco Use   Smoking status: Never Smoker   Smokeless tobacco: Never Used  Substance Use Topics   Alcohol use: Not on file   Drug use: Not on file   Social History   Social History Narrative   Not on file     Family History:    Family History  Problem Relation Age of Onset   Aneurysm Mother    Breast cancer Sister      ROS:  Please see the history of present illness.   All other ROS reviewed and negative.     Physical Exam/Data:  Vitals:   05/16/19 0642 05/16/19 0846 05/16/19 1046 05/16/19 1214  BP:  (!) 142/59 112/75 (!) 134/54  Pulse:  73 72 76  Resp:  17  18  Temp:  98.3 F (36.8 C)  (!) 97.5 F (36.4 C)  TempSrc:  Oral  Oral  SpO2:  96% 95% 99%  Weight: 64.5 kg     Height:        Intake/Output Summary (Last 24 hours) at 05/16/2019 1258 Last data filed at 05/16/2019 1046 Gross per 24 hour  Intake 2444.93 ml  Output 1400 ml  Net 1044.93 ml   Filed Weights   05/14/19 2105 05/15/19 0727 05/16/19 0642  Weight: 65.7 kg 63.1 kg 64.5 kg   Body mass index is 23.68 kg/m.   Physical exam per MD as documented below. Physical Exam  Constitutional: She is oriented to person, place, and time. She appears well-developed and well-nourished. No distress.  Healthy-appearing elderly woman.  Well groomed.  HENT:  Head: Normocephalic and atraumatic.  Eyes: EOM are normal.  Neck: Normal range of motion. Neck supple. No JVD present.  Cardiovascular: Normal rate, regular rhythm, normal heart sounds and intact distal pulses. Exam reveals no gallop and no friction rub.  No murmur heard. Pulmonary/Chest: Effort normal and breath sounds normal. No respiratory distress. She has no wheezes. She has no rales.  Nonlabored  Abdominal: Soft. Bowel sounds are normal. She exhibits no distension. There is no  abdominal tenderness. There is no rebound.  No HSM or HJR  Musculoskeletal: Normal range of motion.        General: No edema (Trace bilateral lower extremity pitting edema).  Neurological: She is alert and oriented to person, place, and time. No cranial nerve deficit.  Skin: Skin is warm and dry. No erythema.  Psychiatric: She has a normal mood and affect. Her behavior is normal. Judgment and thought content normal.  Nursing note and vitals reviewed.   EKG:  The EKG was personally reviewed and demonstrates:  sinus rhythm with PACs, TWI in V2-3 (chronic), no STE/D.  Telemetry:  Note prepped remotely; unable to review  Relevant CV Studies:  Left heart catheterization-PCA 03/2018 (Anterior STEMI): Culprit - pLAD 100% -> 2 overlapping stent DES PCI (SYNERGY DES 3X12 & STENT SYNERGY DES 2.75X20). pRCA 75% (med Rx), mCx 25%. LVEDP 23 mm Hg.    Echocardiogram 06/2018 (70-month follow-up post anterior STEMI): Apical anterior and mid to apical anteroseptal akinesis.  EF~45%.  GR 2 DD.  Moderate MR.  Otherwise normal. Study Conclusions    Echocardiogram 05/15/2019: EF estimated 35 to 40%.  Akinesis of the distal anteroseptal wall with overall moderate LV dysfunction (EF 40); mild (GR 1) diastolic dysfunction; mild MR.   Laboratory Data:  Chemistry Recent Labs  Lab 05/15/19 0520 05/15/19 1153 05/16/19 0753  NA 138 137 138  K 3.8 3.9 4.0  CL 106 104 107  CO2 24 24 23   GLUCOSE 104* 105* 99  BUN 16 20 15   CREATININE 1.59* 1.72* 1.07*  CALCIUM 8.8* 8.8* 8.7*  GFRNONAA 27* 25* 44*  GFRAA 32* 29* 51*  ANIONGAP 8 9 8     Recent Labs  Lab 05/15/19 0520  PROT 7.2  ALBUMIN 2.7*  AST 44*  ALT 25  ALKPHOS 157*  BILITOT 0.7   Hematology Recent Labs  Lab 05/14/19 0306  WBC 7.5  RBC 3.79*  HGB 11.6*  HCT 36.4  MCV 96.0  MCH 30.6  MCHC 31.9  RDW 13.7  PLT 239   Cardiac  Enzymes Recent Labs  Lab 05/14/19 0306 05/14/19 0530 05/14/19 1121 05/14/19 1700  TROPONINI <0.03  <0.03 <0.03 <0.03   No results for input(s): TROPIPOC in the last 168 hours.  BNP Recent Labs  Lab 05/14/19 0540 05/15/19 0520  BNP 414.3* 685.1*    DDimer No results for input(s): DDIMER in the last 168 hours.  Radiology/Studies:  Ct Chest Wo Contrast  Result Date: 05/14/2019 CLINICAL DATA:  Pt had an episode of severe retching, burping yesterday, which were very similar symptoms to her MI last year. Pt denies any chest pain, or SOB EXAM: CT CHEST WITHOUT CONTRAST TECHNIQUE: Multidetector CT imaging of the chest was performed following the standard protocol without IV contrast. COMPARISON:  None. FINDINGS: Cardiovascular: Aortic atherosclerosis. No thoracic aortic aneurysm. Diffuse coronary artery calcifications, presumed coronary artery stent within the LEFT anterior descending coronary artery. Cardiomegaly. No pericardial effusion. Mediastinum/Nodes: No mass or enlarged lymph nodes seen within the mediastinum or perihilar regions. Esophagus is unremarkable. Trachea and central bronchi are unremarkable. Lungs/Pleura: Mild bibasilar scarring/fibrosis. Additional biapical scarring/fibrosis. Lungs are otherwise clear. No confluent consolidation to suggest a developing pneumonia. No pleural effusion or pneumothorax. Upper Abdomen: Moderate RIGHT-sided hydronephrosis, incompletely imaged, however moderate dilatation of the RIGHT renal collecting system was also described on an abdomen ultrasound of 08/06/2009 suggesting a chronic benign process such as partial congenital UPJ obstruction. Musculoskeletal: Mild degenerative spurring throughout the thoracic spine. No acute or suspicious osseous finding. IMPRESSION: 1. No acute findings. No evidence of pneumonia or pulmonary edema. Esophagus is unremarkable. 2. Cardiomegaly. Diffuse coronary artery calcifications, with presumed coronary artery stent within the left anterior descending coronary artery. Aortic Atherosclerosis (ICD10-I70.0). Electronically  Signed   By: Franki Cabot M.D.   On: 05/14/2019 11:14   Dg Chest Portable 1 View  Result Date: 05/14/2019 CLINICAL DATA:  85 year old with burping. Weakness. EXAM: PORTABLE CHEST 1 VIEW COMPARISON:  None. FINDINGS: Low lung volumes. Nonspecific patchy bibasilar opacities. Normal heart size for technique. Aortic atherosclerosis. Mild biapical pleuroparenchymal scarring. No pulmonary edema, pleural fluid or pneumothorax. No acute osseous abnormalities are seen. IMPRESSION: 1. Low lung volumes with patchy bibasilar opacities, atelectasis versus pneumonia, including atypical viral infection. 2.  Aortic Atherosclerosis (ICD10-I70.0). Electronically Signed   By: Keith Rake M.D.   On: 05/14/2019 03:17    Assessment and Plan:   1. CAD s/p PCI/DES to LAD 03/2018: patient presented with weakness, fatigue, nausea, and belching, similar to MI presentation but not as severe. Resolved with GI cocktail. Trops negative x4, EKG non-ischemic.  - -Echo this admission revealed drop in EF from 45% (06/2018) to 35-40% with akinesis of the mid-apical anteroseptal wall (seen on previous), G1DD, and mild MR. EF discrepancy possibly due to inter-interpreter variability.  -- Her last ischemic evaluation was a LHC 03/2018 to evaluate STEMI which showed 100% LAD stenosis managed with PCI/DES, and 75% RCA stenosis and 25% LCx stenosis medically managed.   -- Suspect that her symptoms are likely GI in origin. - Continue plavix and statin - Could consider outpatient ischemic evaluation if symptoms reoccur   2. Chronic combined CHF: patient presented with generalized weakness, fatigue, and DOE. CXR with possible infiltrate. CT Chest without pneumonia or pulmonary edema. BNP [id number]. Echo this admission revealed drop in EF from 45% (06/2018) to 35-40% with akinesis of the mid-apical anteroseptal wall (seen on previous), G1DD, and mild MR. EF discrepancy possibly due to inter-interpreter variability. She received a 1x dose of IV  lasix 20mg  on 05/14/2019. She appears euvolemic at  this time. Metoprolol held for bradycardia this admission - no complaints of dizziness, lightheadedness, or syncope prior to arrival. - Resume home metoprolol 12.5mg  BID  --   3. HLD: LDL 32 05/2018. AST mildly elevated and lipitor reduced to 40mg  daily from 80mg  daily - Continue statin - Continue close monitoring of LFTs   Will arrange an outpatient telemedicine visit with Dr. Lillia Pauls in one month.      Signed, Abigail Butts, PA-C  05/16/2019 12:58 PM [id number]  ATTENDING ATTESTATION  I have seen, examined and evaluated the patient this PM along with Abigail Butts, PA-C .  After reviewing all the available data and chart, we discussed the patients laboratory, study & physical findings as well as symptoms in detail.  The physical exam above was performed by me.  I agree with her findings as well as impression recommendations as per our discussion.    Attending adjustments noted in italics.   Overall, I have reviewed all 3 of her echocardiogram images from May of last year to now.  She has persistent mid apical anterior wall essential akinesis from anterior MI.  There is clear inter-reader variability and estimation of the EF.  I have reviewed the images side-by-side both myself and with Dr. Debara Pickett and Dr. Margaretann Loveless -both agree that the EF is probably more consistent with about 40 to 45% overall.  No real significant changes noted.  As such, I would not place too much weight on the difference in these echocardiogram ports.  Would not do any further evaluation.  I agree with restarting her beta-blocker. At this point it sounds like her symptoms are not cardiac in nature and would probably be safe for her to be discharged from cardiac standpoint.  [facility] will sign off.   Medication Recommendations: Restart home dose beta-blocker Other recommendations (labs, testing, etc): No further testing Follow up as an outpatient:   Will arrange an outpatient telemedicine visit with Dr. Lillia Pauls in one month     Glenetta Hew, M.D., M.S. Interventional Cardiologist   Pager # [phone] Phone # [phone] [address] [place], [place] [address]    For questions or updates, please contact [place] Please consult [email or web address] for contact info under Cardiology/STEMI.

## 2019-05-19 LAB — CULTURE, BLOOD (ROUTINE X 2)
Culture: NO GROWTH
Culture: NO GROWTH
Special Requests: ADEQUATE

## 2019-05-22 DIAGNOSIS — I255 Ischemic cardiomyopathy: Secondary | ICD-10-CM | POA: Diagnosis not present

## 2019-05-22 DIAGNOSIS — R918 Other nonspecific abnormal finding of lung field: Secondary | ICD-10-CM | POA: Diagnosis not present

## 2019-06-14 ENCOUNTER — Encounter: Payer: Self-pay | Admitting: Interventional Cardiology

## 2019-06-15 ENCOUNTER — Telehealth: Payer: Self-pay | Admitting: Interventional Cardiology

## 2019-06-15 NOTE — Telephone Encounter (Signed)
New Message         COVID-19 Pre-Screening Questions:   In the past 7 to 10 days have you had a cough,  shortness of breath, headache, congestion, fever (100 or greater) body aches, chills, sore throat, or sudden loss of taste or sense of smell? NO  Have you been around anyone with known Covid 19. NO  Have you been around anyone who is awaiting Covid 19 test results in the past 7 to 10 days? NO  Have you been around anyone who has been exposed to Covid 19, or has mentioned symptoms of Covid 19 within the past 7 to 10 days? NO Pts daugther will be assisting the pt and she answers NO to all screening questions   If you have any concerns/questions about symptoms patients report during screening (either on the phone or at threshold). Contact the provider seeing the patient or DOD for further guidance.  If neither are available contact a member of the leadership team.

## 2019-06-15 NOTE — Progress Notes (Signed)
Cardiology Office Note   Date:  06/18/2019   ID:  Yang, April Oct 14, 1921, MRN [id number]  PCP:  Mateo Flow, MD    No chief complaint on file.  CAD  Wt Readings from Last 3 Encounters:  06/18/19 140 lb 9.6 oz (63.8 kg)  05/16/19 142 lb 4.8 oz (64.5 kg)  04/26/19 141 lb (64 kg)       History of Present Illness: April Yang is a 85 y.o. female  Who wasadmitted 04/25/2018 with anterior STEMI treated with DES to the LAD x2. Significant 70 to 75% RCA to be treated medically. Ischemic cardiomyopathy LVEF 35 to 40% with grade 1 DD on 2D echo 04/26/2018.Patient had heart failure in the hospital improved with Lasix. Spironolactone was added. Blood pressure was too soft to add ACE or ARB.  She hadtrouble swallowing the Lipitor.  8/19 echo showed:  - Normal LV size with EF 45%. Mid to apical anteroseptal akinesis, apical anterior akinesis. Normal RV size and systolic function. There was moderate mitral regurgitation.  Was hospitalized with dehydration, fatigue.  EF found to be in the 35-40% range.  : 2020 June echo  The left ventricle has moderately reduced systolic function, with an ejection fraction of 35-40%. The cavity size was normal. Left ventricular diastolic Doppler parameters are consistent with impaired relaxation.  2. Akinesis of the mid-apical anteroseptal wall.  3. The right ventricle has normal systolic function. The cavity was normal.  4. The mitral valve is grossly normal. There is mild mitral annular calcification present.  5. The tricuspid valve is grossly normal.  6. The aortic valve is tricuspid. Mild thickening of the aortic valve. No stenosis of the aortic valve.  7. Akinesis of the distal anteroseptal wall with overall moderate LV dysfunction (EF 40); mild diastolic dysfunction; mild MR.  It was noted by consultant, Dr. Ellyn Hack: "Overall, I have reviewed all 3 of her echocardiogram images from May of last year to now.  She has  persistent mid apical anterior wall essential akinesis from anterior MI.  There is clear inter-reader variability and estimation of the EF.  I have reviewed the images side-by-side both myself and with Dr. Debara Pickett and Dr. Margaretann Loveless -both agree that the EF is probably more consistent with about 40 to 45% overall.  No real significant changes noted.  As such, I would not place too much weight on the difference in these echocardiogram ports.  Would not do any further evaluation.  I agree with restarting her beta-blocker. At this point it sounds like her symptoms are not cardiac in nature and would probably be safe for her to be discharged from cardiac standpoint."  Daughter felt that she had eaten somethings that gave her GI sx.  These sx have resolved.   She has been stressed due to recent death in the family. BP has been high.    Denies : Chest pain. Dizziness. Leg edema. Nitroglycerin use. Orthopnea. Palpitations. Paroxysmal nocturnal dyspnea. Shortness of breath. Syncope.   She has some unsteadiness.  Occasionally uses a cane.    Past Medical History:  Diagnosis Date  . Acute anterior wall MI ([place]) 04/25/2018  . CAD in native artery    a. cath 100% pLAD s/p DES x2. medical managment of 70% RCA  . HLD (hyperlipidemia)   . Hypothyroidism   . Ischemic cardiomyopathy     Past Surgical History:  Procedure Laterality Date  . CORONARY STENT INTERVENTION N/A 04/25/2018   Procedure: CORONARY STENT INTERVENTION;  Surgeon: Jettie Booze, MD;  Location: [facility] INVASIVE [facility];  pLAD 100% -> 2 overlapping stent DES PCI (SYNERGY DES 3X12 & STENT SYNERGY DES 2.75X20)  . LEFT HEART CATH AND CORONARY ANGIOGRAPHY N/A 04/25/2018   Procedure: LEFT HEART CATH AND CORONARY ANGIOGRAPHY;  Surgeon: Jettie Booze, MD;  Location: [facility] INVASIVE [facility] LAB;;   Culprit - pLAD 100% -> 2 overlapping stent DES PCI  pRCA 75% (med Rx), mCx 25%. LVEDP 23 mm Hg.     Current Outpatient Medications  Medication Sig  Dispense Refill  . atorvastatin (LIPITOR) 40 MG tablet Take 1 tablet (40 mg total) by mouth daily at 6 PM. 90 tablet 3  . clopidogrel (PLAVIX) 75 MG tablet Take 1 tablet (75 mg total) by mouth daily. 90 tablet 3  . levothyroxine (SYNTHROID, LEVOTHROID) 75 MCG tablet Take 75 mcg by mouth daily before breakfast.  3  . metoprolol tartrate (LOPRESSOR) 25 MG tablet Take 0.5 tablets (12.5 mg total) by mouth 2 (two) times daily. 90 tablet 3  . nitroGLYCERIN (NITROSTAT) 0.4 MG SL tablet Place 1 tablet (0.4 mg total) under the tongue every 5 (five) minutes as needed. (Patient taking differently: Place 0.4 mg under the tongue every 5 (five) minutes as needed for chest pain. ) 25 tablet 12   No current facility-administered medications for this visit.     Allergies:   Patient has no known allergies.    Social History:  The patient  reports that she has never smoked. She has never used smokeless tobacco. She reports that she does not drink alcohol or use drugs.   Family History:  The patient's *family history includes Aneurysm in her mother; Breast cancer in her sister.    ROS:  Please see the history of present illness.   Otherwise, review of systems are positive for weakness generally.   All other systems are reviewed and negative.    PHYSICAL EXAM: VS:  BP (!) 102/58   Pulse 69   Ht 5\' 5"  (1.651 m)   Wt 140 lb 9.6 oz (63.8 kg)   SpO2 96%   BMI 23.40 kg/m  , BMI Body mass index is 23.4 kg/m. GEN: Well nourished, well developed, in no acute distress  HEENT: normal  Neck: no JVD, carotid bruits, or masses Cardiac: RRR; no murmurs, rubs, or gallops,no edema  Respiratory:  clear to auscultation bilaterally, normal work of breathing GI: soft, nontender, nondistended, + BS MS: no deformity or atrophy  Skin: warm and dry, no rash Neuro:  Strength and sensation are intact Psych: euthymic mood, full affect   EKG:   The ekg ordered 04/2019 demonstrates NSR, anterior T wave inversions    Recent Labs: 05/14/2019: Hemoglobin 11.6; Platelets 239; TSH 1.215 05/15/2019: ALT 25; B Natriuretic Peptide 685.1 05/16/2019: BUN 15; Creatinine, Ser 1.07; Potassium 4.0; Sodium 138   Lipid Panel    Component Value Date/Time   CHOL 95 (L) 06/20/2018 0939   TRIG 125 06/20/2018 0939   HDL 38 (L) 06/20/2018 0939   CHOLHDL 2.5 06/20/2018 0939   CHOLHDL 5.3 04/27/2018 0330   VLDL 40 04/27/2018 0330   LDLCALC 32 06/20/2018 0939     Other studies Reviewed: Additional studies/ records that were reviewed today with results demonstrating: hospital records reviewed.   ASSESSMENT AND PLAN:  1. CAD: No angina.  Feeling better since she left the hospital. COntinue aggressive secondary prevention.  2. Old MI: No CHF.  3. Hyperlipidemia: LDL 48.  Continue lipid lowering therapy. 4.  Chronic combined heart failure: Appears euvolemic.  5. I encouraged healthy nutrition and avoiding falls.   Current medicines are reviewed at length with the patient today.  The patient concerns regarding her medicines were addressed.  The following changes have been made:  No change  Labs/ tests ordered today include:  No orders of the defined types were placed in this encounter.   Recommend 150 minutes/week of aerobic exercise Low fat, low carb, high fiber diet recommended  Disposition:   FU in 6 months, virtual OK   Signed, Larae Grooms, MD  06/18/2019 12:03 PM    [facility] [address], [place], [place]  [address] Phone: [phone]; Fax: [phone]

## 2019-06-18 ENCOUNTER — Ambulatory Visit (INDEPENDENT_AMBULATORY_CARE_PROVIDER_SITE_OTHER): Payer: Medicare Other | Admitting: Interventional Cardiology

## 2019-06-18 ENCOUNTER — Other Ambulatory Visit: Payer: Self-pay

## 2019-06-18 ENCOUNTER — Encounter: Payer: Self-pay | Admitting: Interventional Cardiology

## 2019-06-18 VITALS — BP 102/58 | HR 69 | Ht 65.0 in | Wt 140.6 lb

## 2019-06-18 DIAGNOSIS — I25118 Atherosclerotic heart disease of native coronary artery with other forms of angina pectoris: Secondary | ICD-10-CM | POA: Diagnosis not present

## 2019-06-18 DIAGNOSIS — E782 Mixed hyperlipidemia: Secondary | ICD-10-CM

## 2019-06-18 DIAGNOSIS — I5042 Chronic combined systolic (congestive) and diastolic (congestive) heart failure: Secondary | ICD-10-CM | POA: Diagnosis not present

## 2019-06-18 DIAGNOSIS — I252 Old myocardial infarction: Secondary | ICD-10-CM | POA: Diagnosis not present

## 2019-06-18 MED ORDER — METOPROLOL TARTRATE 25 MG PO TABS: 12.5000 mg | ORAL_TABLET | Freq: Two times a day (BID) | ORAL | 3 refills | Status: AC

## 2019-06-18 MED ORDER — ATORVASTATIN CALCIUM 40 MG PO TABS: 40.0000 mg | ORAL_TABLET | Freq: Every day | ORAL | 3 refills | Status: AC

## 2019-06-18 NOTE — Patient Instructions (Signed)
Medication Instructions:  Your physician recommends that you continue on your current medications as directed. Please refer to the Current Medication list given to you today.  If you need a refill on your cardiac medications before your next appointment, please call your pharmacy.   Lab work: None Ordered  If you have labs (blood work) drawn today and your tests are completely normal, you will receive your results only by: Marland Kitchen MyChart Message (if you have MyChart) OR . A paper copy in the mail If you have any lab test that is abnormal or we need to change your treatment, we will call you to review the results.  Testing/Procedures: None ordered  Follow-Up: At [facility], you and your health needs are our priority.  As part of our continuing mission to provide you with exceptional heart care, we have created designated Provider Care Teams.  These Care Teams include your primary Cardiologist (physician) and Advanced Practice Providers (APPs -  Physician Assistants and Nurse Practitioners) who all work together to provide you with the care you need, when you need it. . You will need a VIRTUAL follow up appointment in 6 months.  Please call our office 2 months in advance to schedule this appointment.  You may see Casandra Doffing, MD or one of the following Advanced Practice Providers on your designated Care Team:   . Lyda Jester, PA-C . Dayna Dunn, PA-C . Ermalinda Barrios, PA-C  Any Other Special Instructions Will Be Listed Below (If Applicable).

## 2019-08-23 DIAGNOSIS — D510 Vitamin B12 deficiency anemia due to intrinsic factor deficiency: Secondary | ICD-10-CM | POA: Diagnosis not present

## 2019-08-23 DIAGNOSIS — E782 Mixed hyperlipidemia: Secondary | ICD-10-CM | POA: Diagnosis not present

## 2019-08-23 DIAGNOSIS — Z23 Encounter for immunization: Secondary | ICD-10-CM | POA: Diagnosis not present

## 2019-08-23 DIAGNOSIS — D649 Anemia, unspecified: Secondary | ICD-10-CM | POA: Diagnosis not present

## 2019-08-23 DIAGNOSIS — Z79899 Other long term (current) drug therapy: Secondary | ICD-10-CM | POA: Diagnosis not present

## 2019-11-25 NOTE — Progress Notes (Signed)
Virtual Visit via Video Note   This visit type was conducted due to national recommendations for restrictions regarding the COVID-19 Pandemic (e.g. social distancing) in an effort to limit this patient's exposure and mitigate transmission in our community.  Due to her co-morbid illnesses, this patient is at least at moderate risk for complications without adequate follow up.  This format is felt to be most appropriate for this patient at this time.  All issues noted in this document were discussed and addressed.  A limited physical exam was performed with this format.  Please refer to the patient's chart for her consent to telehealth for [facility].   Date:  11/28/2019   ID:  April, Yang 11-Aug-1921, MRN [id number]  Patient Location: Home Provider Location: Office  PCP:  Mateo Flow, MD  Cardiologist:  Larae Grooms, MD  Electrophysiologist:  None   Evaluation Performed:  Follow-Up Visit  Chief Complaint:  CAD/HTN  History of Present Illness:    April Yang is a 85 y.o. female with Who wasadmitted 04/25/2018 with anterior STEMI treated with DES to the LAD x2. Significant 70 to 75% RCA to be treated medically. Ischemic cardiomyopathy LVEF 35 to 40% with grade 1 DD on 2D echo 04/26/2018.Patient had heart failure in the hospital improved with Lasix. Spironolactone was added. Blood pressure was too soft to add ACE or ARB.  She hadtrouble swallowing the Lipitor.  8/19 echo showed:  - Normal LV size with EF 45%. Mid to apical anteroseptal akinesis, apical anterior akinesis. Normal RV size and systolic function. There was moderate mitral regurgitation.  Was hospitalized with dehydration, fatigue.  EF found to be in the 35-40% range.  : 2020 June echo The left ventricle has moderately reduced systolic function, with an ejection fraction of 35-40%. The cavity size was normal. Left ventricular diastolic Doppler parameters are consistent with impaired  relaxation. 2. Akinesis of the mid-apical anteroseptal wall. 3. The right ventricle has normal systolic function. The cavity was normal. 4. The mitral valve is grossly normal. There is mild mitral annular calcification present. 5. The tricuspid valve is grossly normal. 6. The aortic valve is tricuspid. Mild thickening of the aortic valve. No stenosis of the aortic valve. 7. Akinesis of the distal anteroseptal wall with overall moderate LV dysfunction (EF 40); mild diastolic dysfunction; mild MR.  It was noted by consultant, Dr. Ellyn Hack: "Overall, I have reviewed all 3 of her echocardiogram images from May of last year to now. She has persistent mid apical anteriorwall essential akinesis from anterior MI. There is clear inter-reader variability and estimation of the EF. I have reviewed the imagesside-by-side both myself and with Dr. Debara Pickett and Dr.Acharya -both agree that the EF is probably more consistent with about 40 to 45% overall. No real significant changes noted.  As such, I would not place too much weight on the difference in these echocardiogram ports. Would not do any further evaluation. I agree with restarting her beta-blocker. At this point it sounds like her symptoms are not cardiac in nature and would probably be safe for her to be discharged from cardiac standpoint."  Daughter felt that she had eaten somethings that gave her GI sx.  These sx have resolved.   In July 2020, she had been stressed due to recent death in the family. BP had been high.  Healthy nutrition and avoiding falls were recommended.    The patient does not have symptoms concerning for COVID-19 infection (fever, chills, cough, or new  shortness of breath).   Since the last visit, she has done well.  Denies : Chest pain. Dizziness. Leg edema. Nitroglycerin use. Orthopnea. Palpitations. Paroxysmal nocturnal dyspnea. Shortness of breath. Syncope.   She is staying at home for the most part.  No  bleeding issues.    Past Medical History:  Diagnosis Date  . Acute anterior wall MI ([place]) 04/25/2018  . CAD in native artery    a. cath 100% pLAD s/p DES x2. medical managment of 70% RCA  . HLD (hyperlipidemia)   . Hypothyroidism   . Ischemic cardiomyopathy    Past Surgical History:  Procedure Laterality Date  . CORONARY STENT INTERVENTION N/A 04/25/2018   Procedure: CORONARY STENT INTERVENTION;  Surgeon: Jettie Booze, MD;  Location: [facility] INVASIVE CV LAB;  pLAD 100% -> 2 overlapping stent DES PCI (SYNERGY DES 3X12 & STENT SYNERGY DES 2.75X20)  . LEFT HEART CATH AND CORONARY ANGIOGRAPHY N/A 04/25/2018   Procedure: LEFT HEART CATH AND CORONARY ANGIOGRAPHY;  Surgeon: Jettie Booze, MD;  Location: [facility] INVASIVE CV LAB;;   Culprit - pLAD 100% -> 2 overlapping stent DES PCI  pRCA 75% (med Rx), mCx 25%. LVEDP 23 mm Hg.     Current Meds  Medication Sig  . atorvastatin (LIPITOR) 40 MG tablet Take 1 tablet (40 mg total) by mouth daily at 6 PM.  . clopidogrel (PLAVIX) 75 MG tablet Take 1 tablet (75 mg total) by mouth daily.  Marland Kitchen levothyroxine (SYNTHROID, LEVOTHROID) 75 MCG tablet Take 75 mcg by mouth daily before breakfast.  . metoprolol tartrate (LOPRESSOR) 25 MG tablet Take 0.5 tablets (12.5 mg total) by mouth 2 (two) times daily.  . nitroGLYCERIN (NITROSTAT) 0.4 MG SL tablet Place 1 tablet (0.4 mg total) under the tongue every 5 (five) minutes as needed.     Allergies:   Patient has no known allergies.   Social History   Tobacco Use  . Smoking status: Never Smoker  . Smokeless tobacco: Never Used  Substance Use Topics  . Alcohol use: Never  . Drug use: Never     Family Hx: The patient's family history includes Aneurysm in her mother; Breast cancer in her sister.  ROS:   Please see the history of present illness.    Occasional loss of appetite All other systems reviewed and are negative.   Prior CV studies:   The following studies were reviewed today:  Labs  reviewed  Labs/Other Tests and Data Reviewed:    EKG:  An ECG dated 05/2019 was personally reviewed today and demonstrated:  NSR, anterior T wave inversion  Recent Labs: 05/14/2019: Hemoglobin 11.6; Platelets 239; TSH 1.215 05/15/2019: ALT 25; B Natriuretic Peptide 685.1 05/16/2019: BUN 15; Creatinine, Ser 1.07; Potassium 4.0; Sodium 138   Recent Lipid Panel Lab Results  Component Value Date/Time   CHOL 95 (L) 06/20/2018 09:39 AM   TRIG 125 06/20/2018 09:39 AM   HDL 38 (L) 06/20/2018 09:39 AM   CHOLHDL 2.5 06/20/2018 09:39 AM   CHOLHDL 5.3 04/27/2018 03:30 AM   LDLCALC 32 06/20/2018 09:39 AM    Wt Readings from Last 3 Encounters:  11/28/19 140 lb (63.5 kg)  06/18/19 140 lb 9.6 oz (63.8 kg)  05/16/19 142 lb 4.8 oz (64.5 kg)     Objective:    Vital Signs:  BP 127/60   Pulse (!) 56   Ht 5\' 3"  (1.6 m)   Wt 140 lb (63.5 kg)   BMI 24.80 kg/m    VITAL SIGNS:  reviewed  GEN:  no acute distress RESPIRATORY:  normal respiratory effort, symmetric expansion NEURO:  alert and oriented x 3, no obvious focal deficit PSYCH:  normal affect exam limited by video format  ASSESSMENT & PLAN:    1. CAD/Old MI: No angina.  Continue aggressive secondary prevention.  2. Hyperlipidemia: LDL 44. COntinue lipid lowering therapy.  3. Chronic combined systolic diastolic CHF: Appears euvolemic. 4. Mitral regurgitation: Moderate in the past.   COVID-19 Education: The signs and symptoms of COVID-19 were discussed with the patient and how to seek care for testing (follow up with PCP or arrange E-visit).  The importance of social distancing was discussed today.  Time:   Today, I have spent 15 minutes with the patient with telehealth technology discussing the above problems.     Medication Adjustments/Labs and Tests Ordered: Current medicines are reviewed at length with the patient today.  Concerns regarding medicines are outlined above.   Tests Ordered: No orders of the defined types were  placed in this encounter.   Medication Changes: No orders of the defined types were placed in this encounter.   Follow Up:  Either In Person or Virtual in 9 month(s)  Signed, Larae Grooms, MD  11/28/2019 9:54 AM    [place]

## 2019-11-28 ENCOUNTER — Encounter: Payer: Self-pay | Admitting: Interventional Cardiology

## 2019-11-28 ENCOUNTER — Telehealth (INDEPENDENT_AMBULATORY_CARE_PROVIDER_SITE_OTHER): Payer: Medicare Other | Admitting: Interventional Cardiology

## 2019-11-28 ENCOUNTER — Other Ambulatory Visit: Payer: Self-pay

## 2019-11-28 VITALS — BP 127/60 | HR 56 | Ht 63.0 in | Wt 140.0 lb

## 2019-11-28 DIAGNOSIS — I25118 Atherosclerotic heart disease of native coronary artery with other forms of angina pectoris: Secondary | ICD-10-CM

## 2019-11-28 DIAGNOSIS — I340 Nonrheumatic mitral (valve) insufficiency: Secondary | ICD-10-CM

## 2019-11-28 DIAGNOSIS — E782 Mixed hyperlipidemia: Secondary | ICD-10-CM | POA: Diagnosis not present

## 2019-11-28 DIAGNOSIS — I5042 Chronic combined systolic (congestive) and diastolic (congestive) heart failure: Secondary | ICD-10-CM

## 2019-11-28 DIAGNOSIS — I252 Old myocardial infarction: Secondary | ICD-10-CM

## 2019-11-28 NOTE — Patient Instructions (Signed)
Medication Instructions:  Your physician recommends that you continue on your current medications as directed. Please refer to the Current Medication list given to you today.  *If you need a refill on your cardiac medications before your next appointment, please call your pharmacy*  Lab Work: None ordered  If you have labs (blood work) drawn today and your tests are completely normal, you will receive your results only by: Marland Kitchen MyChart Message (if you have MyChart) OR . A paper copy in the mail If you have any lab test that is abnormal or we need to change your treatment, we will call you to review the results.  Testing/Procedures: None ordered  Follow-Up: At [facility], you and your health needs are our priority.  As part of our continuing mission to provide you with exceptional heart care, we have created designated Provider Care Teams.  These Care Teams include your primary Cardiologist (physician) and Advanced Practice Providers (APPs -  Physician Assistants and Nurse Practitioners) who all work together to provide you with the care you need, when you need it.  Your next appointment:   9 month(s)  The format for your next appointment:   In Person  Provider:   You may see Larae Grooms, MD or one of the following Advanced Practice Providers on your designated Care Team:    Melina Copa, PA-C  Ermalinda Barrios, PA-C   Other Instructions

## 2020-02-27 DIAGNOSIS — E039 Hypothyroidism, unspecified: Secondary | ICD-10-CM | POA: Diagnosis not present

## 2020-02-27 DIAGNOSIS — E782 Mixed hyperlipidemia: Secondary | ICD-10-CM | POA: Diagnosis not present

## 2020-03-27 DIAGNOSIS — R531 Weakness: Secondary | ICD-10-CM | POA: Diagnosis not present

## 2020-03-27 DIAGNOSIS — R509 Fever, unspecified: Secondary | ICD-10-CM | POA: Diagnosis not present

## 2020-03-27 DIAGNOSIS — Z743 Need for continuous supervision: Secondary | ICD-10-CM | POA: Diagnosis not present

## 2020-03-28 DIAGNOSIS — I255 Ischemic cardiomyopathy: Secondary | ICD-10-CM | POA: Diagnosis not present

## 2020-03-28 DIAGNOSIS — E7849 Other hyperlipidemia: Secondary | ICD-10-CM | POA: Diagnosis not present

## 2020-03-31 DIAGNOSIS — R0902 Hypoxemia: Secondary | ICD-10-CM | POA: Diagnosis not present

## 2020-03-31 DIAGNOSIS — Z79899 Other long term (current) drug therapy: Secondary | ICD-10-CM | POA: Diagnosis not present

## 2020-03-31 DIAGNOSIS — Z743 Need for continuous supervision: Secondary | ICD-10-CM | POA: Diagnosis not present

## 2020-03-31 DIAGNOSIS — E871 Hypo-osmolality and hyponatremia: Secondary | ICD-10-CM | POA: Diagnosis not present

## 2020-03-31 DIAGNOSIS — Z7401 Bed confinement status: Secondary | ICD-10-CM | POA: Diagnosis not present

## 2020-03-31 DIAGNOSIS — N179 Acute kidney failure, unspecified: Secondary | ICD-10-CM | POA: Diagnosis not present

## 2020-03-31 DIAGNOSIS — R0689 Other abnormalities of breathing: Secondary | ICD-10-CM | POA: Diagnosis not present

## 2020-03-31 DIAGNOSIS — R69 Illness, unspecified: Secondary | ICD-10-CM | POA: Diagnosis not present

## 2020-03-31 DIAGNOSIS — Z23 Encounter for immunization: Secondary | ICD-10-CM | POA: Diagnosis not present

## 2020-03-31 DIAGNOSIS — J189 Pneumonia, unspecified organism: Secondary | ICD-10-CM | POA: Diagnosis not present

## 2020-03-31 DIAGNOSIS — J9601 Acute respiratory failure with hypoxia: Secondary | ICD-10-CM | POA: Diagnosis not present

## 2020-03-31 DIAGNOSIS — R531 Weakness: Secondary | ICD-10-CM | POA: Diagnosis not present

## 2020-03-31 DIAGNOSIS — R0600 Dyspnea, unspecified: Secondary | ICD-10-CM | POA: Diagnosis not present

## 2020-03-31 DIAGNOSIS — I10 Essential (primary) hypertension: Secondary | ICD-10-CM | POA: Diagnosis not present

## 2020-03-31 DIAGNOSIS — E860 Dehydration: Secondary | ICD-10-CM | POA: Diagnosis not present

## 2020-03-31 DIAGNOSIS — E039 Hypothyroidism, unspecified: Secondary | ICD-10-CM | POA: Diagnosis not present

## 2020-03-31 DIAGNOSIS — M6259 Muscle wasting and atrophy, not elsewhere classified, multiple sites: Secondary | ICD-10-CM | POA: Diagnosis not present

## 2020-03-31 DIAGNOSIS — A419 Sepsis, unspecified organism: Secondary | ICD-10-CM | POA: Diagnosis not present

## 2020-03-31 DIAGNOSIS — Z7902 Long term (current) use of antithrombotics/antiplatelets: Secondary | ICD-10-CM | POA: Diagnosis not present

## 2020-03-31 DIAGNOSIS — M160 Bilateral primary osteoarthritis of hip: Secondary | ICD-10-CM | POA: Diagnosis not present

## 2020-03-31 DIAGNOSIS — R0602 Shortness of breath: Secondary | ICD-10-CM | POA: Diagnosis not present

## 2020-03-31 DIAGNOSIS — I252 Old myocardial infarction: Secondary | ICD-10-CM | POA: Diagnosis not present

## 2020-03-31 DIAGNOSIS — R5381 Other malaise: Secondary | ICD-10-CM | POA: Diagnosis not present

## 2020-03-31 DIAGNOSIS — R2681 Unsteadiness on feet: Secondary | ICD-10-CM | POA: Diagnosis not present

## 2020-03-31 DIAGNOSIS — E785 Hyperlipidemia, unspecified: Secondary | ICD-10-CM | POA: Diagnosis not present

## 2020-03-31 DIAGNOSIS — R262 Difficulty in walking, not elsewhere classified: Secondary | ICD-10-CM | POA: Diagnosis not present

## 2020-04-04 DIAGNOSIS — E871 Hypo-osmolality and hyponatremia: Secondary | ICD-10-CM | POA: Diagnosis not present

## 2020-04-04 DIAGNOSIS — J9601 Acute respiratory failure with hypoxia: Secondary | ICD-10-CM | POA: Diagnosis not present

## 2020-04-04 DIAGNOSIS — R531 Weakness: Secondary | ICD-10-CM | POA: Diagnosis not present

## 2020-04-04 DIAGNOSIS — R5381 Other malaise: Secondary | ICD-10-CM | POA: Diagnosis not present

## 2020-04-04 DIAGNOSIS — R262 Difficulty in walking, not elsewhere classified: Secondary | ICD-10-CM | POA: Diagnosis not present

## 2020-04-04 DIAGNOSIS — E039 Hypothyroidism, unspecified: Secondary | ICD-10-CM | POA: Diagnosis not present

## 2020-04-04 DIAGNOSIS — Z79899 Other long term (current) drug therapy: Secondary | ICD-10-CM | POA: Diagnosis not present

## 2020-04-04 DIAGNOSIS — E785 Hyperlipidemia, unspecified: Secondary | ICD-10-CM | POA: Diagnosis not present

## 2020-04-04 DIAGNOSIS — R2681 Unsteadiness on feet: Secondary | ICD-10-CM | POA: Diagnosis not present

## 2020-04-04 DIAGNOSIS — Z7902 Long term (current) use of antithrombotics/antiplatelets: Secondary | ICD-10-CM | POA: Diagnosis not present

## 2020-04-04 DIAGNOSIS — I10 Essential (primary) hypertension: Secondary | ICD-10-CM | POA: Diagnosis not present

## 2020-04-04 DIAGNOSIS — Z7401 Bed confinement status: Secondary | ICD-10-CM | POA: Diagnosis not present

## 2020-04-04 DIAGNOSIS — Z23 Encounter for immunization: Secondary | ICD-10-CM | POA: Diagnosis not present

## 2020-04-04 DIAGNOSIS — N179 Acute kidney failure, unspecified: Secondary | ICD-10-CM | POA: Diagnosis not present

## 2020-04-04 DIAGNOSIS — I252 Old myocardial infarction: Secondary | ICD-10-CM | POA: Diagnosis not present

## 2020-04-04 DIAGNOSIS — R69 Illness, unspecified: Secondary | ICD-10-CM | POA: Diagnosis not present

## 2020-04-04 DIAGNOSIS — A419 Sepsis, unspecified organism: Secondary | ICD-10-CM | POA: Diagnosis not present

## 2020-04-04 DIAGNOSIS — M6259 Muscle wasting and atrophy, not elsewhere classified, multiple sites: Secondary | ICD-10-CM | POA: Diagnosis not present

## 2020-04-04 DIAGNOSIS — Z743 Need for continuous supervision: Secondary | ICD-10-CM | POA: Diagnosis not present

## 2020-04-04 DIAGNOSIS — J189 Pneumonia, unspecified organism: Secondary | ICD-10-CM | POA: Diagnosis not present

## 2020-06-18 DIAGNOSIS — N179 Acute kidney failure, unspecified: Secondary | ICD-10-CM | POA: Diagnosis not present

## 2020-06-18 DIAGNOSIS — R300 Dysuria: Secondary | ICD-10-CM | POA: Diagnosis not present

## 2020-06-18 DIAGNOSIS — R6889 Other general symptoms and signs: Secondary | ICD-10-CM | POA: Diagnosis not present

## 2020-07-07 DIAGNOSIS — R2681 Unsteadiness on feet: Secondary | ICD-10-CM | POA: Diagnosis not present

## 2020-07-07 DIAGNOSIS — J9601 Acute respiratory failure with hypoxia: Secondary | ICD-10-CM | POA: Diagnosis not present

## 2020-07-07 DIAGNOSIS — M6259 Muscle wasting and atrophy, not elsewhere classified, multiple sites: Secondary | ICD-10-CM | POA: Diagnosis not present

## 2020-07-07 DIAGNOSIS — I10 Essential (primary) hypertension: Secondary | ICD-10-CM | POA: Diagnosis not present

## 2020-07-07 DIAGNOSIS — R531 Weakness: Secondary | ICD-10-CM | POA: Diagnosis not present

## 2020-07-08 DIAGNOSIS — I10 Essential (primary) hypertension: Secondary | ICD-10-CM | POA: Diagnosis not present

## 2020-07-08 DIAGNOSIS — J9601 Acute respiratory failure with hypoxia: Secondary | ICD-10-CM | POA: Diagnosis not present

## 2020-07-08 DIAGNOSIS — Z1159 Encounter for screening for other viral diseases: Secondary | ICD-10-CM | POA: Diagnosis not present

## 2020-07-08 DIAGNOSIS — M6259 Muscle wasting and atrophy, not elsewhere classified, multiple sites: Secondary | ICD-10-CM | POA: Diagnosis not present

## 2020-07-08 DIAGNOSIS — R2681 Unsteadiness on feet: Secondary | ICD-10-CM | POA: Diagnosis not present

## 2020-07-08 DIAGNOSIS — R531 Weakness: Secondary | ICD-10-CM | POA: Diagnosis not present

## 2020-07-09 DIAGNOSIS — M6259 Muscle wasting and atrophy, not elsewhere classified, multiple sites: Secondary | ICD-10-CM | POA: Diagnosis not present

## 2020-07-09 DIAGNOSIS — R531 Weakness: Secondary | ICD-10-CM | POA: Diagnosis not present

## 2020-07-09 DIAGNOSIS — R2681 Unsteadiness on feet: Secondary | ICD-10-CM | POA: Diagnosis not present

## 2020-07-09 DIAGNOSIS — I10 Essential (primary) hypertension: Secondary | ICD-10-CM | POA: Diagnosis not present

## 2020-07-09 DIAGNOSIS — J9601 Acute respiratory failure with hypoxia: Secondary | ICD-10-CM | POA: Diagnosis not present

## 2020-07-10 DIAGNOSIS — J9601 Acute respiratory failure with hypoxia: Secondary | ICD-10-CM | POA: Diagnosis not present

## 2020-07-10 DIAGNOSIS — Z1159 Encounter for screening for other viral diseases: Secondary | ICD-10-CM | POA: Diagnosis not present

## 2020-07-10 DIAGNOSIS — R2681 Unsteadiness on feet: Secondary | ICD-10-CM | POA: Diagnosis not present

## 2020-07-10 DIAGNOSIS — M6259 Muscle wasting and atrophy, not elsewhere classified, multiple sites: Secondary | ICD-10-CM | POA: Diagnosis not present

## 2020-07-10 DIAGNOSIS — I10 Essential (primary) hypertension: Secondary | ICD-10-CM | POA: Diagnosis not present

## 2020-07-10 DIAGNOSIS — R531 Weakness: Secondary | ICD-10-CM | POA: Diagnosis not present

## 2020-07-11 DIAGNOSIS — R2681 Unsteadiness on feet: Secondary | ICD-10-CM | POA: Diagnosis not present

## 2020-07-11 DIAGNOSIS — R531 Weakness: Secondary | ICD-10-CM | POA: Diagnosis not present

## 2020-07-11 DIAGNOSIS — M6259 Muscle wasting and atrophy, not elsewhere classified, multiple sites: Secondary | ICD-10-CM | POA: Diagnosis not present

## 2020-07-11 DIAGNOSIS — I10 Essential (primary) hypertension: Secondary | ICD-10-CM | POA: Diagnosis not present

## 2020-07-11 DIAGNOSIS — J9601 Acute respiratory failure with hypoxia: Secondary | ICD-10-CM | POA: Diagnosis not present

## 2020-07-14 DIAGNOSIS — M6259 Muscle wasting and atrophy, not elsewhere classified, multiple sites: Secondary | ICD-10-CM | POA: Diagnosis not present

## 2020-07-14 DIAGNOSIS — I10 Essential (primary) hypertension: Secondary | ICD-10-CM | POA: Diagnosis not present

## 2020-07-14 DIAGNOSIS — J9601 Acute respiratory failure with hypoxia: Secondary | ICD-10-CM | POA: Diagnosis not present

## 2020-07-14 DIAGNOSIS — R531 Weakness: Secondary | ICD-10-CM | POA: Diagnosis not present

## 2020-07-14 DIAGNOSIS — R2681 Unsteadiness on feet: Secondary | ICD-10-CM | POA: Diagnosis not present

## 2020-07-15 DIAGNOSIS — J9601 Acute respiratory failure with hypoxia: Secondary | ICD-10-CM | POA: Diagnosis not present

## 2020-07-15 DIAGNOSIS — M6259 Muscle wasting and atrophy, not elsewhere classified, multiple sites: Secondary | ICD-10-CM | POA: Diagnosis not present

## 2020-07-15 DIAGNOSIS — I10 Essential (primary) hypertension: Secondary | ICD-10-CM | POA: Diagnosis not present

## 2020-07-15 DIAGNOSIS — R2681 Unsteadiness on feet: Secondary | ICD-10-CM | POA: Diagnosis not present

## 2020-07-15 DIAGNOSIS — R531 Weakness: Secondary | ICD-10-CM | POA: Diagnosis not present

## 2020-07-16 DIAGNOSIS — J9601 Acute respiratory failure with hypoxia: Secondary | ICD-10-CM | POA: Diagnosis not present

## 2020-07-16 DIAGNOSIS — M6259 Muscle wasting and atrophy, not elsewhere classified, multiple sites: Secondary | ICD-10-CM | POA: Diagnosis not present

## 2020-07-16 DIAGNOSIS — I10 Essential (primary) hypertension: Secondary | ICD-10-CM | POA: Diagnosis not present

## 2020-07-16 DIAGNOSIS — R531 Weakness: Secondary | ICD-10-CM | POA: Diagnosis not present

## 2020-07-16 DIAGNOSIS — R2681 Unsteadiness on feet: Secondary | ICD-10-CM | POA: Diagnosis not present

## 2020-07-17 DIAGNOSIS — I10 Essential (primary) hypertension: Secondary | ICD-10-CM | POA: Diagnosis not present

## 2020-07-17 DIAGNOSIS — R2681 Unsteadiness on feet: Secondary | ICD-10-CM | POA: Diagnosis not present

## 2020-07-17 DIAGNOSIS — J9601 Acute respiratory failure with hypoxia: Secondary | ICD-10-CM | POA: Diagnosis not present

## 2020-07-17 DIAGNOSIS — M6259 Muscle wasting and atrophy, not elsewhere classified, multiple sites: Secondary | ICD-10-CM | POA: Diagnosis not present

## 2020-07-17 DIAGNOSIS — R531 Weakness: Secondary | ICD-10-CM | POA: Diagnosis not present

## 2020-07-18 DIAGNOSIS — R2681 Unsteadiness on feet: Secondary | ICD-10-CM | POA: Diagnosis not present

## 2020-07-18 DIAGNOSIS — J9601 Acute respiratory failure with hypoxia: Secondary | ICD-10-CM | POA: Diagnosis not present

## 2020-07-18 DIAGNOSIS — M6259 Muscle wasting and atrophy, not elsewhere classified, multiple sites: Secondary | ICD-10-CM | POA: Diagnosis not present

## 2020-07-18 DIAGNOSIS — I10 Essential (primary) hypertension: Secondary | ICD-10-CM | POA: Diagnosis not present

## 2020-07-18 DIAGNOSIS — R531 Weakness: Secondary | ICD-10-CM | POA: Diagnosis not present

## 2020-07-21 DIAGNOSIS — J9601 Acute respiratory failure with hypoxia: Secondary | ICD-10-CM | POA: Diagnosis not present

## 2020-07-21 DIAGNOSIS — R531 Weakness: Secondary | ICD-10-CM | POA: Diagnosis not present

## 2020-07-21 DIAGNOSIS — I10 Essential (primary) hypertension: Secondary | ICD-10-CM | POA: Diagnosis not present

## 2020-07-21 DIAGNOSIS — Z1159 Encounter for screening for other viral diseases: Secondary | ICD-10-CM | POA: Diagnosis not present

## 2020-07-21 DIAGNOSIS — M6259 Muscle wasting and atrophy, not elsewhere classified, multiple sites: Secondary | ICD-10-CM | POA: Diagnosis not present

## 2020-07-21 DIAGNOSIS — R2681 Unsteadiness on feet: Secondary | ICD-10-CM | POA: Diagnosis not present

## 2020-07-22 DIAGNOSIS — M6259 Muscle wasting and atrophy, not elsewhere classified, multiple sites: Secondary | ICD-10-CM | POA: Diagnosis not present

## 2020-07-22 DIAGNOSIS — R531 Weakness: Secondary | ICD-10-CM | POA: Diagnosis not present

## 2020-07-22 DIAGNOSIS — R2681 Unsteadiness on feet: Secondary | ICD-10-CM | POA: Diagnosis not present

## 2020-07-22 DIAGNOSIS — J9601 Acute respiratory failure with hypoxia: Secondary | ICD-10-CM | POA: Diagnosis not present

## 2020-07-22 DIAGNOSIS — I10 Essential (primary) hypertension: Secondary | ICD-10-CM | POA: Diagnosis not present

## 2020-07-23 DIAGNOSIS — I10 Essential (primary) hypertension: Secondary | ICD-10-CM | POA: Diagnosis not present

## 2020-07-23 DIAGNOSIS — R2681 Unsteadiness on feet: Secondary | ICD-10-CM | POA: Diagnosis not present

## 2020-07-23 DIAGNOSIS — R531 Weakness: Secondary | ICD-10-CM | POA: Diagnosis not present

## 2020-07-23 DIAGNOSIS — J9601 Acute respiratory failure with hypoxia: Secondary | ICD-10-CM | POA: Diagnosis not present

## 2020-07-23 DIAGNOSIS — L570 Actinic keratosis: Secondary | ICD-10-CM | POA: Diagnosis not present

## 2020-07-23 DIAGNOSIS — L578 Other skin changes due to chronic exposure to nonionizing radiation: Secondary | ICD-10-CM | POA: Diagnosis not present

## 2020-07-23 DIAGNOSIS — M6259 Muscle wasting and atrophy, not elsewhere classified, multiple sites: Secondary | ICD-10-CM | POA: Diagnosis not present

## 2020-07-24 DIAGNOSIS — R2681 Unsteadiness on feet: Secondary | ICD-10-CM | POA: Diagnosis not present

## 2020-07-24 DIAGNOSIS — M6259 Muscle wasting and atrophy, not elsewhere classified, multiple sites: Secondary | ICD-10-CM | POA: Diagnosis not present

## 2020-07-24 DIAGNOSIS — I10 Essential (primary) hypertension: Secondary | ICD-10-CM | POA: Diagnosis not present

## 2020-07-24 DIAGNOSIS — J9601 Acute respiratory failure with hypoxia: Secondary | ICD-10-CM | POA: Diagnosis not present

## 2020-07-24 DIAGNOSIS — R531 Weakness: Secondary | ICD-10-CM | POA: Diagnosis not present

## 2020-07-25 DIAGNOSIS — I10 Essential (primary) hypertension: Secondary | ICD-10-CM | POA: Diagnosis not present

## 2020-07-25 DIAGNOSIS — R2681 Unsteadiness on feet: Secondary | ICD-10-CM | POA: Diagnosis not present

## 2020-07-25 DIAGNOSIS — J9601 Acute respiratory failure with hypoxia: Secondary | ICD-10-CM | POA: Diagnosis not present

## 2020-07-25 DIAGNOSIS — M6259 Muscle wasting and atrophy, not elsewhere classified, multiple sites: Secondary | ICD-10-CM | POA: Diagnosis not present

## 2020-07-25 DIAGNOSIS — R531 Weakness: Secondary | ICD-10-CM | POA: Diagnosis not present

## 2020-07-29 DIAGNOSIS — M6259 Muscle wasting and atrophy, not elsewhere classified, multiple sites: Secondary | ICD-10-CM | POA: Diagnosis not present

## 2020-07-29 DIAGNOSIS — I10 Essential (primary) hypertension: Secondary | ICD-10-CM | POA: Diagnosis not present

## 2020-07-29 DIAGNOSIS — R2681 Unsteadiness on feet: Secondary | ICD-10-CM | POA: Diagnosis not present

## 2020-07-29 DIAGNOSIS — Z1159 Encounter for screening for other viral diseases: Secondary | ICD-10-CM | POA: Diagnosis not present

## 2020-07-29 DIAGNOSIS — J9601 Acute respiratory failure with hypoxia: Secondary | ICD-10-CM | POA: Diagnosis not present

## 2020-07-29 DIAGNOSIS — R531 Weakness: Secondary | ICD-10-CM | POA: Diagnosis not present

## 2020-09-13 IMAGING — DX PORTABLE CHEST - 1 VIEW
1 series · 1 of 1 positions shown · non-contrast
Comparison: None.

CLINICAL DATA: [AGE] with burping. Weakness.

EXAM:
PORTABLE CHEST 1 VIEW

[chest ap]
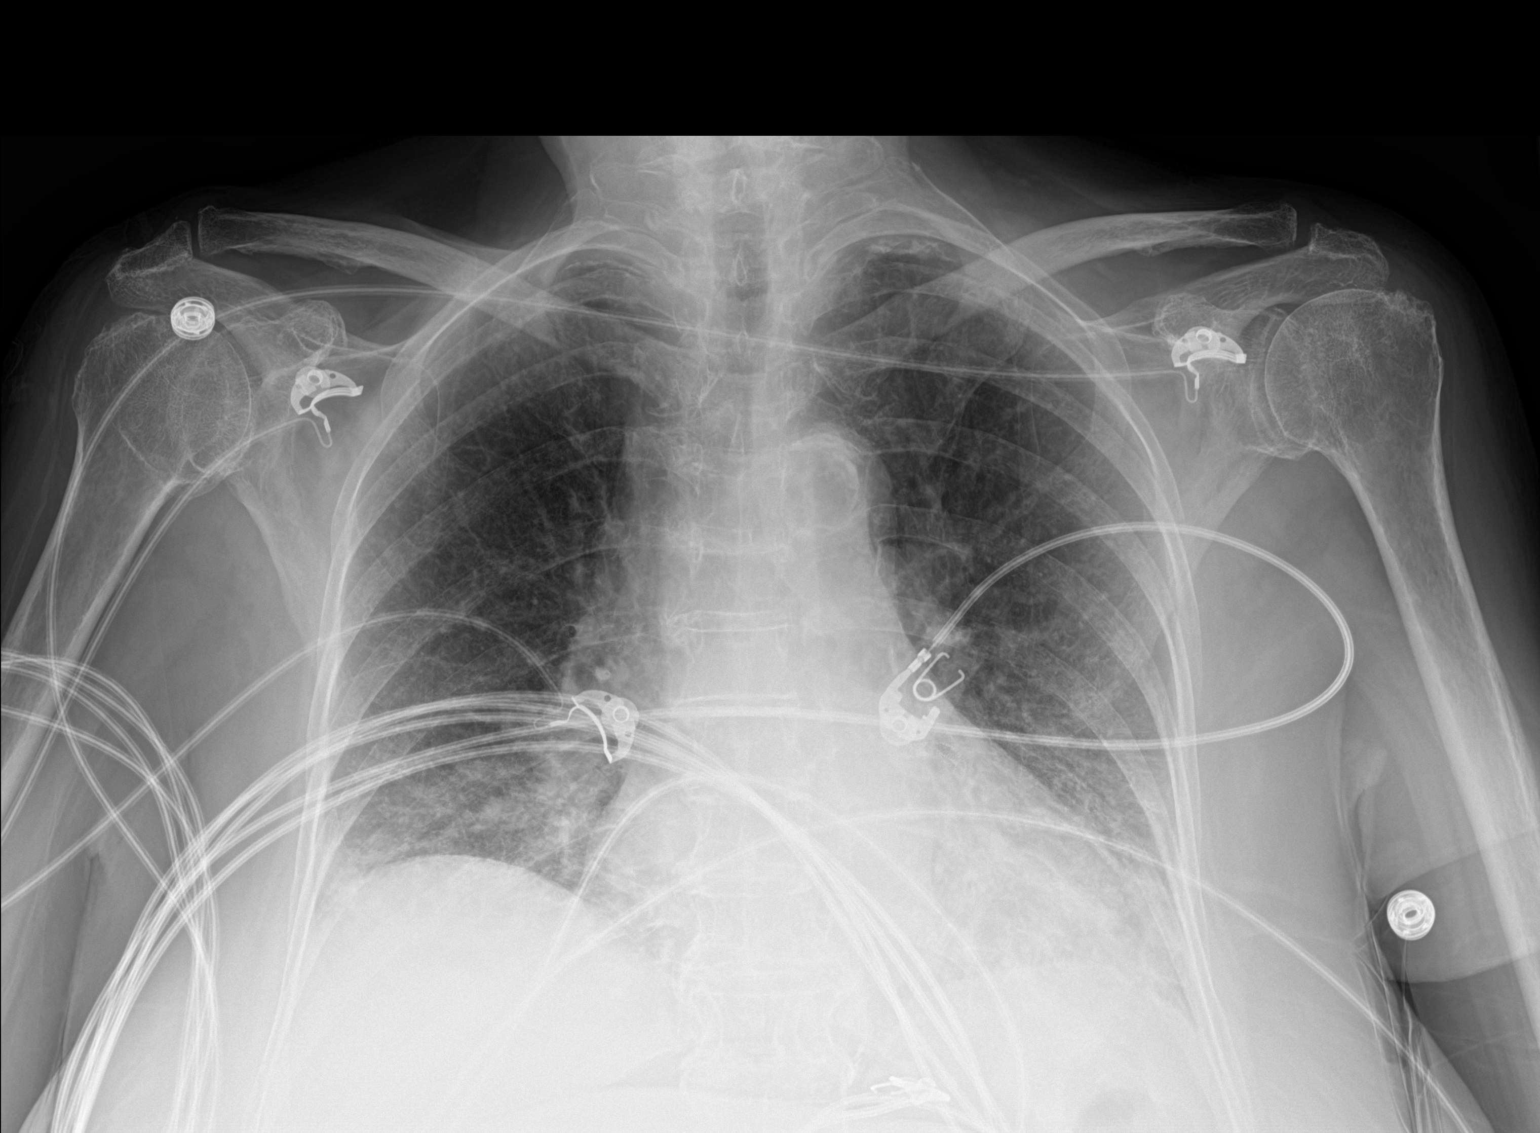

[1 of 1 positions shown; findings below may reference images not displayed]

FINDINGS: Low lung volumes. Nonspecific patchy bibasilar opacities. Normal
heart size for technique. Aortic atherosclerosis. Mild biapical
pleuroparenchymal scarring. No pulmonary edema, pleural fluid or
pneumothorax. No acute osseous abnormalities are seen.
IMPRESSION: 1. Low lung volumes with patchy bibasilar opacities, atelectasis
versus pneumonia, including atypical viral infection.
2.  Aortic Atherosclerosis ([id number]).

## 2020-09-13 IMAGING — CT CT CHEST WITHOUT CONTRAST
2 of 3 series · 15 of 36 positions shown, 18 images · non-contrast
Comparison: None.

CLINICAL DATA: Pt had an episode of severe retching, burping
yesterday, which were very similar symptoms to her MI last year. Pt

EXAM:
CT CHEST WITHOUT CONTRAST
TECHNIQUE: Multidetector CT imaging of the chest was performed following the
standard protocol without IV contrast.

[Series 3: chest w/o 2mm st · axial · non-contrast · 0.66mm/px · z∈[-296,-42]mm · 12 of 149 slices shown, 15 images]
[im 11/149  mediastinal]
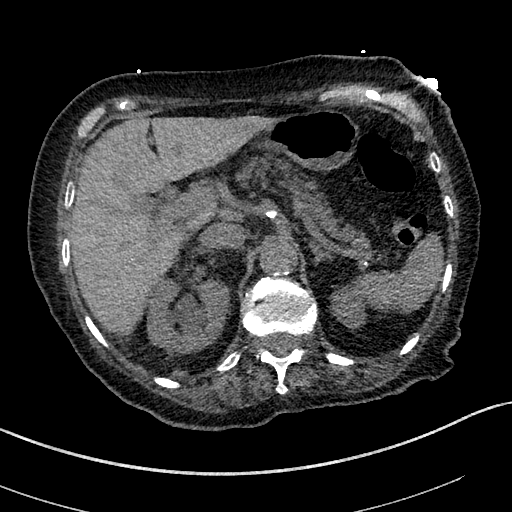
[im 11/149  lung]
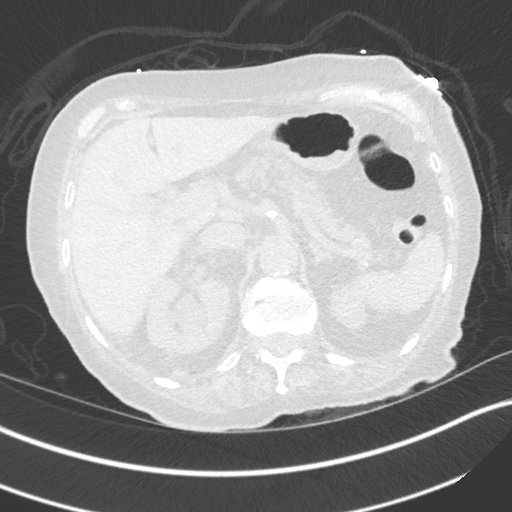
[im 22/149  lung]
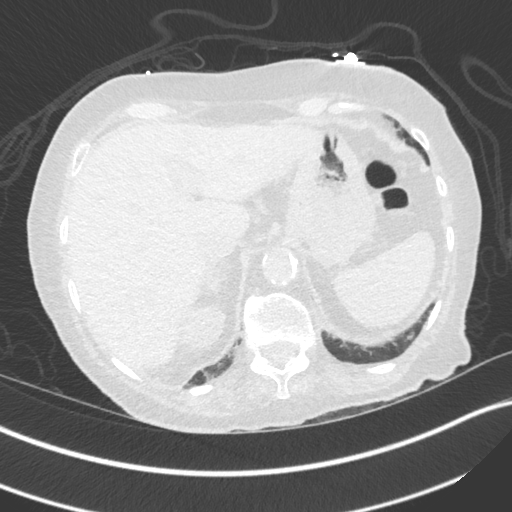
[im 33/149  lung]
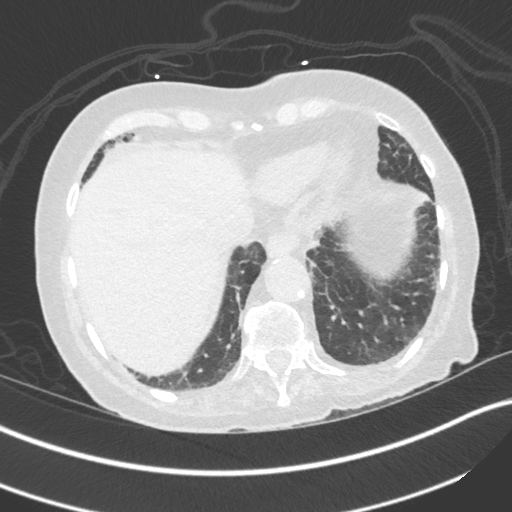
[im 44/149  lung]
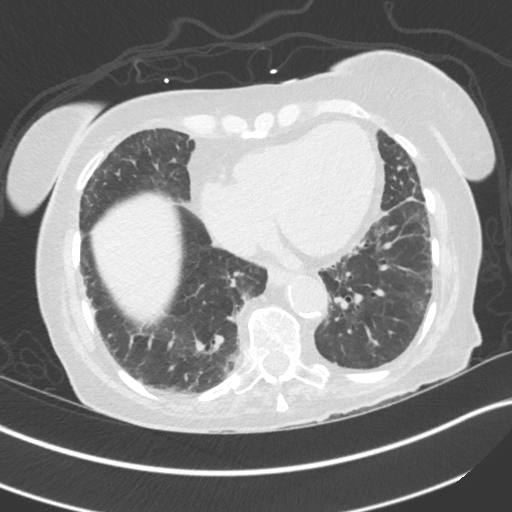
[im 55/149  mediastinal]
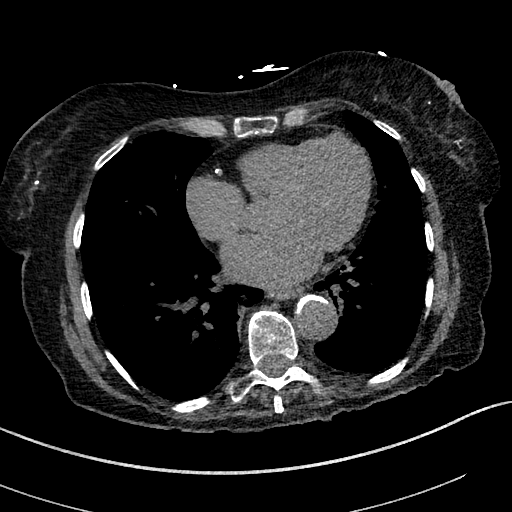
[im 55/149  lung]
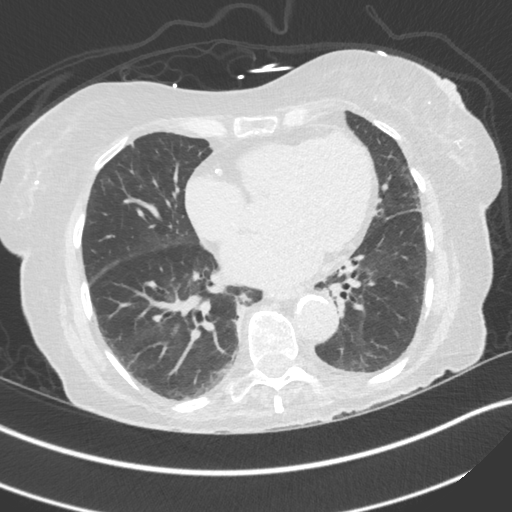
[im 66/149  lung]
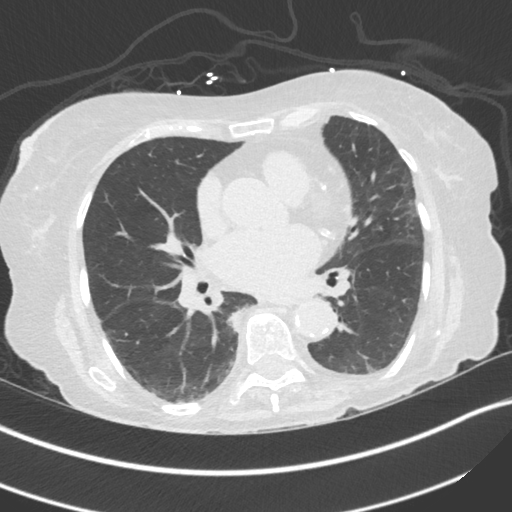
[im 83/149  lung]
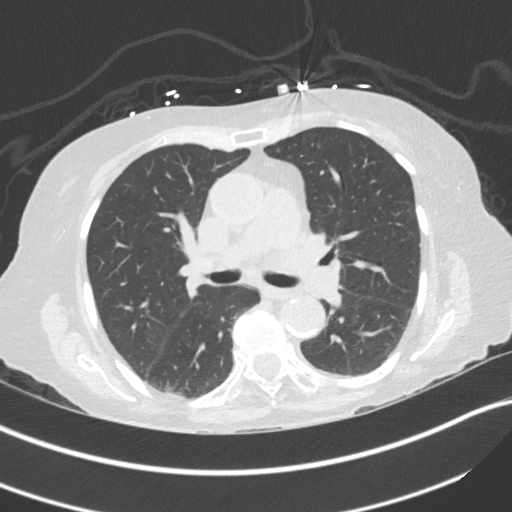
[im 94/149  lung]
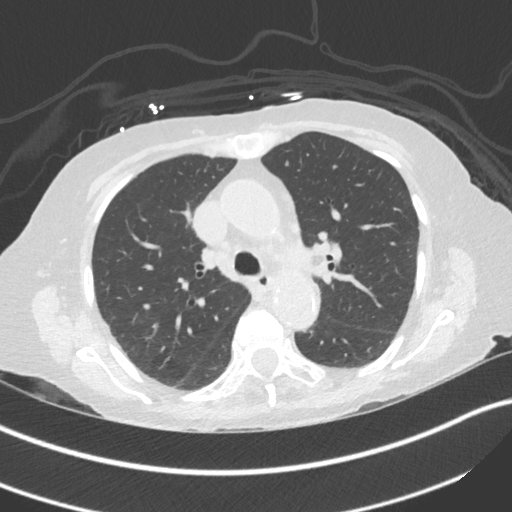
[im 105/149  mediastinal]
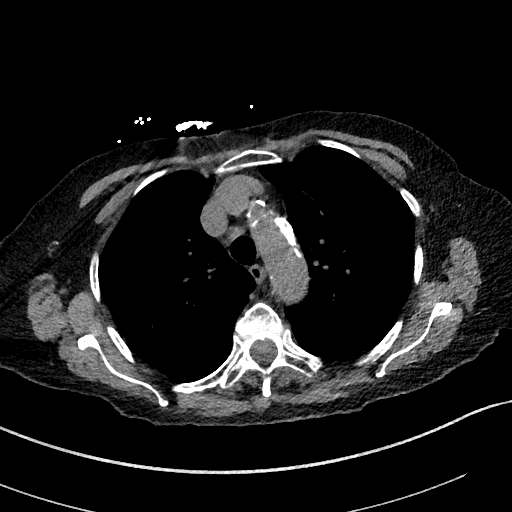
[im 105/149  lung]
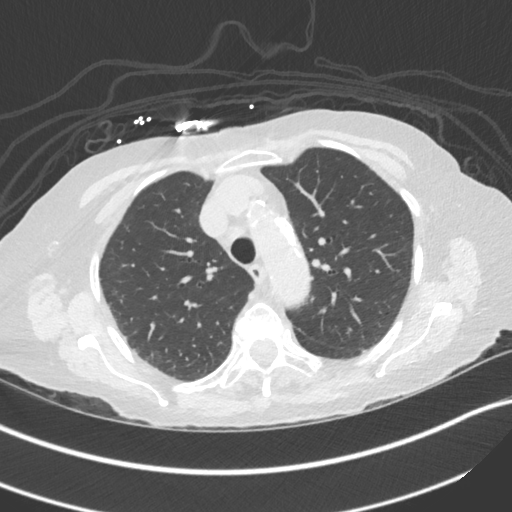
[im 116/149  lung]
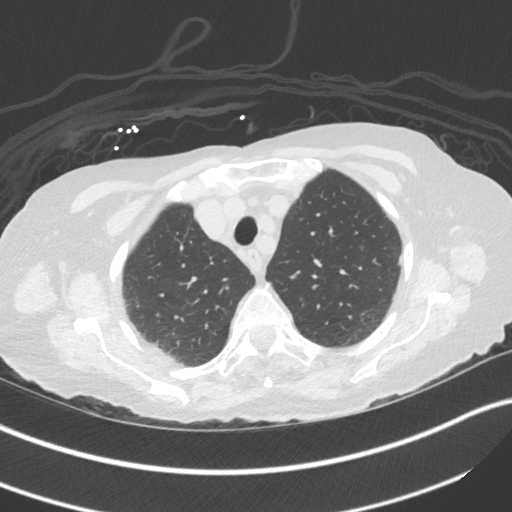
[im 127/149  lung]
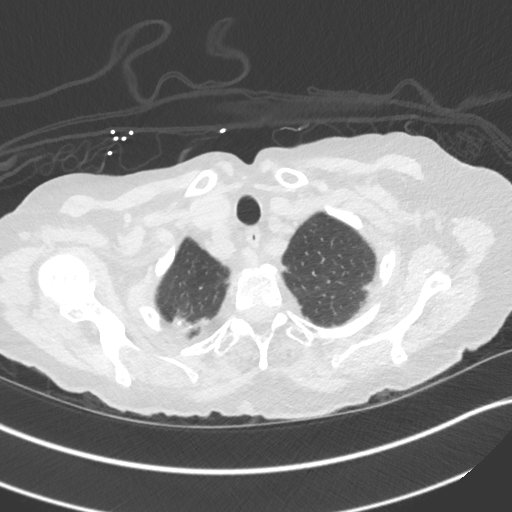
[im 138/149  lung]
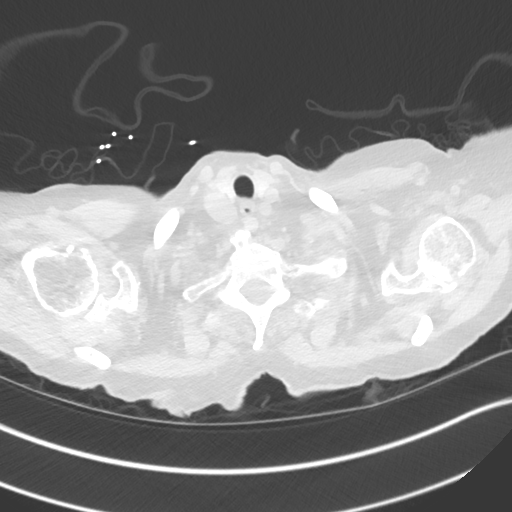

[Series 6: chest w/o 3mm st cor · coronal · non-contrast · 0.57mm/px · 3 of 99 slices shown]
[im 20/99  lung]
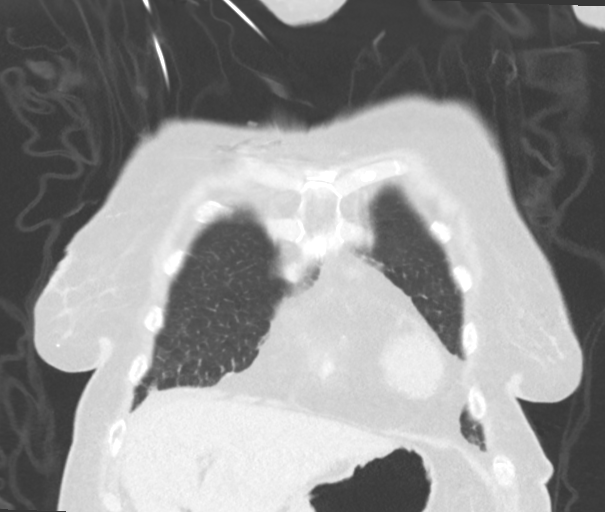
[im 40/99  lung]
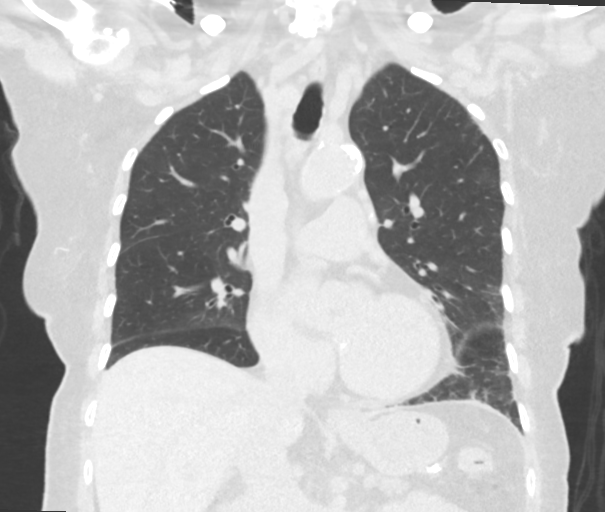
[im 59/99  lung]
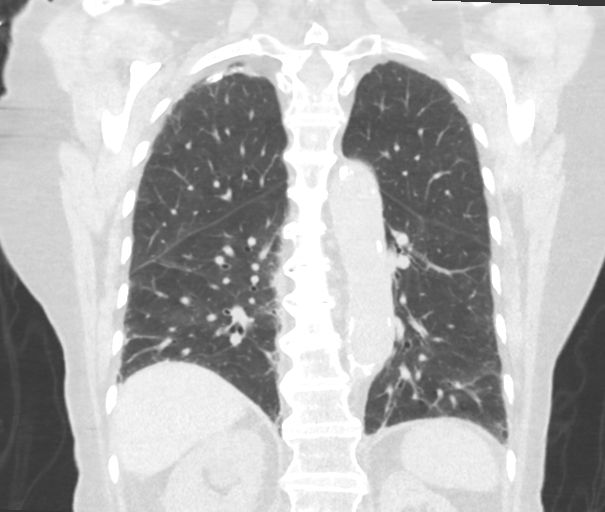

[15 of 36 positions shown; findings below may reference images not displayed]

FINDINGS: Cardiovascular: Aortic atherosclerosis. No thoracic aortic aneurysm.
Diffuse coronary artery calcifications, presumed coronary artery
stent within the LEFT anterior descending coronary artery.
Cardiomegaly. No pericardial effusion.

Mediastinum/Nodes: No mass or enlarged lymph nodes seen within the
mediastinum or perihilar regions. Esophagus is unremarkable. Trachea
and central bronchi are unremarkable.

Lungs/Pleura: Mild bibasilar scarring/fibrosis. Additional biapical
scarring/fibrosis. Lungs are otherwise clear. No confluent
consolidation to suggest a developing pneumonia. No pleural effusion
or pneumothorax.

Upper Abdomen: Moderate RIGHT-sided hydronephrosis, incompletely
imaged, however moderate dilatation of the RIGHT renal collecting
system was also described on an abdomen ultrasound of 08/06/2009
suggesting a chronic benign process such as partial congenital UPJ
obstruction.

Musculoskeletal: Mild degenerative spurring throughout the thoracic
spine. No acute or suspicious osseous finding.
IMPRESSION: 1. No acute findings. No evidence of pneumonia or pulmonary edema.
Esophagus is unremarkable.
2. Cardiomegaly. Diffuse coronary artery calcifications, with
presumed coronary artery stent within the left anterior descending
coronary artery.

Aortic Atherosclerosis ([id number].E).

## 2024-06-15 ENCOUNTER — Encounter: Payer: Self-pay | Admitting: Advanced Practice Midwife
